# Patient Record
Sex: Male | Born: 1970 | Race: White | Hispanic: No | Marital: Married | Smoking: Current every day smoker
Health system: Southern US, Community
[De-identification: ages and names within clinical notes are randomized; demographics above are authoritative.]

## PROBLEM LIST (undated history)

## (undated) DIAGNOSIS — Z86718 Personal history of other venous thrombosis and embolism: Secondary | ICD-10-CM

## (undated) HISTORY — PX: HIP SURGERY: SHX245

## (undated) HISTORY — PX: TONSILLECTOMY: SUR1361

---

## 2007-04-12 ENCOUNTER — Ambulatory Visit: Payer: Self-pay

## 2007-05-03 ENCOUNTER — Ambulatory Visit: Payer: Self-pay | Admitting: Family Medicine

## 2007-08-28 IMAGING — CR DG LUMBAR SPINE AP/LAT/OBLIQUES W/ FLEX AND EXT
1 series · 5 of 5 positions shown · non-contrast
Comparison: none

REASON FOR EXAM: backache
COMMENTS:

[Series 1: view not recorded · 0.17mm/px · 5 of 5 slices shown]
[im 1/5]
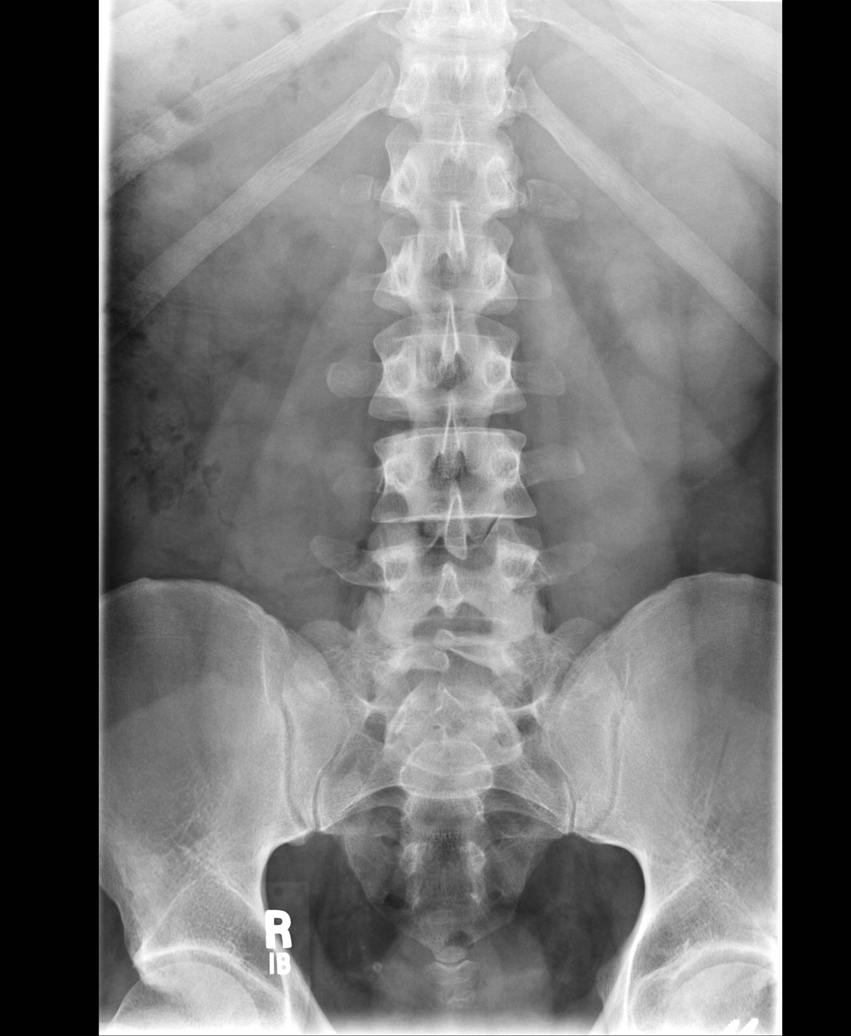
[im 2/5]
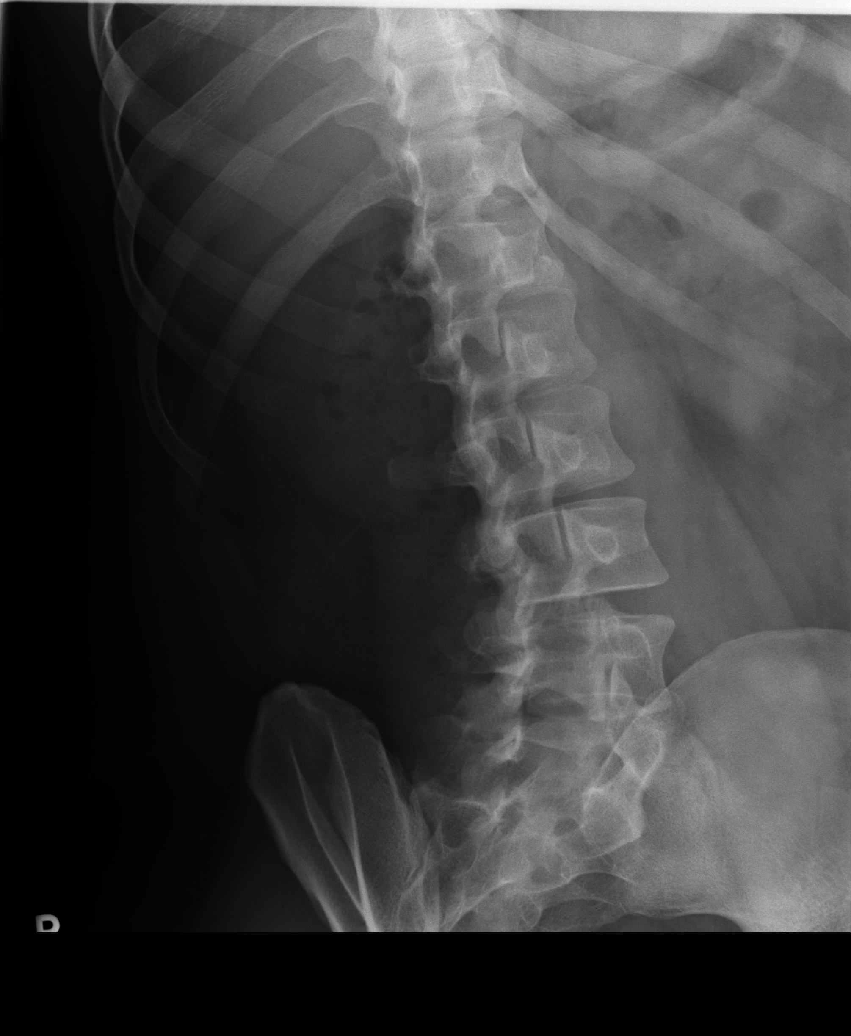
[im 3/5]
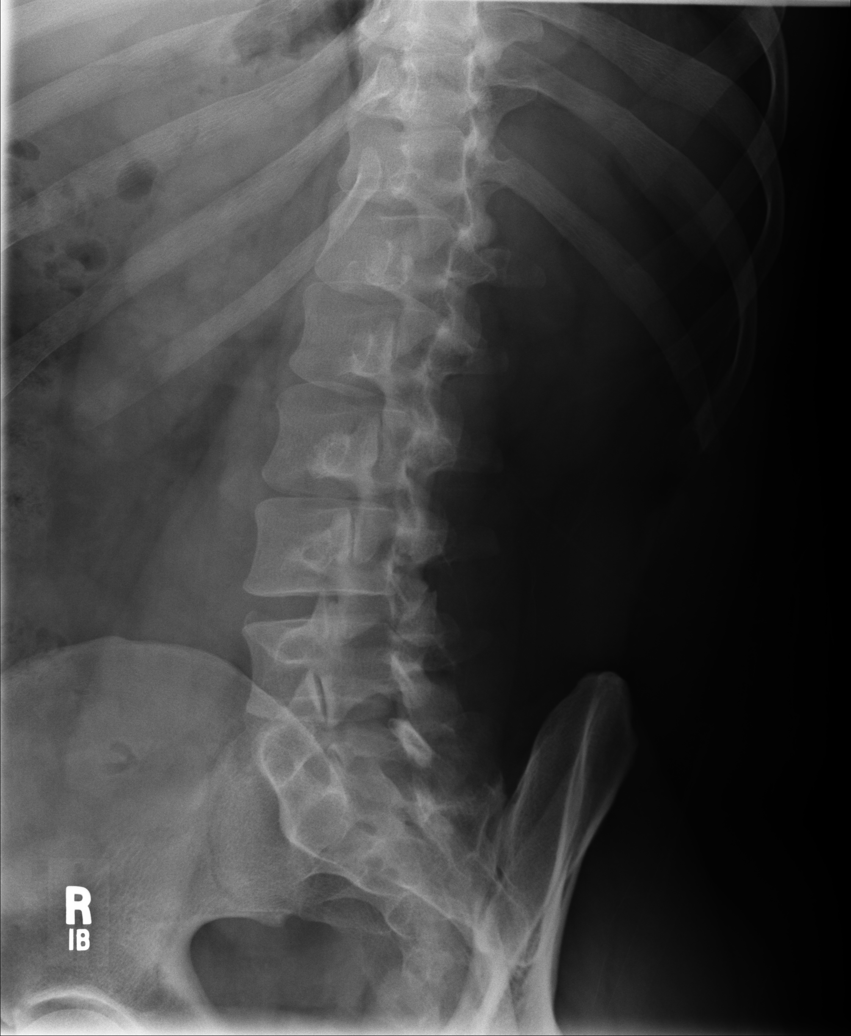
[im 4/5]
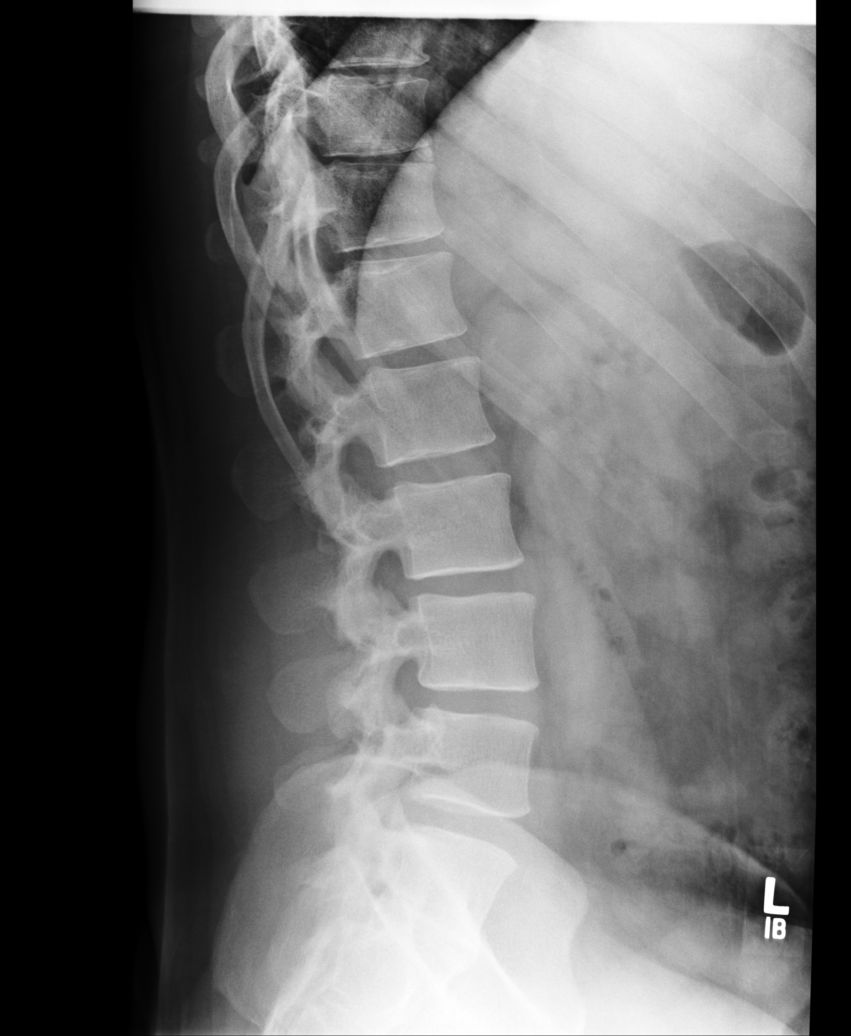
[im 5/5]
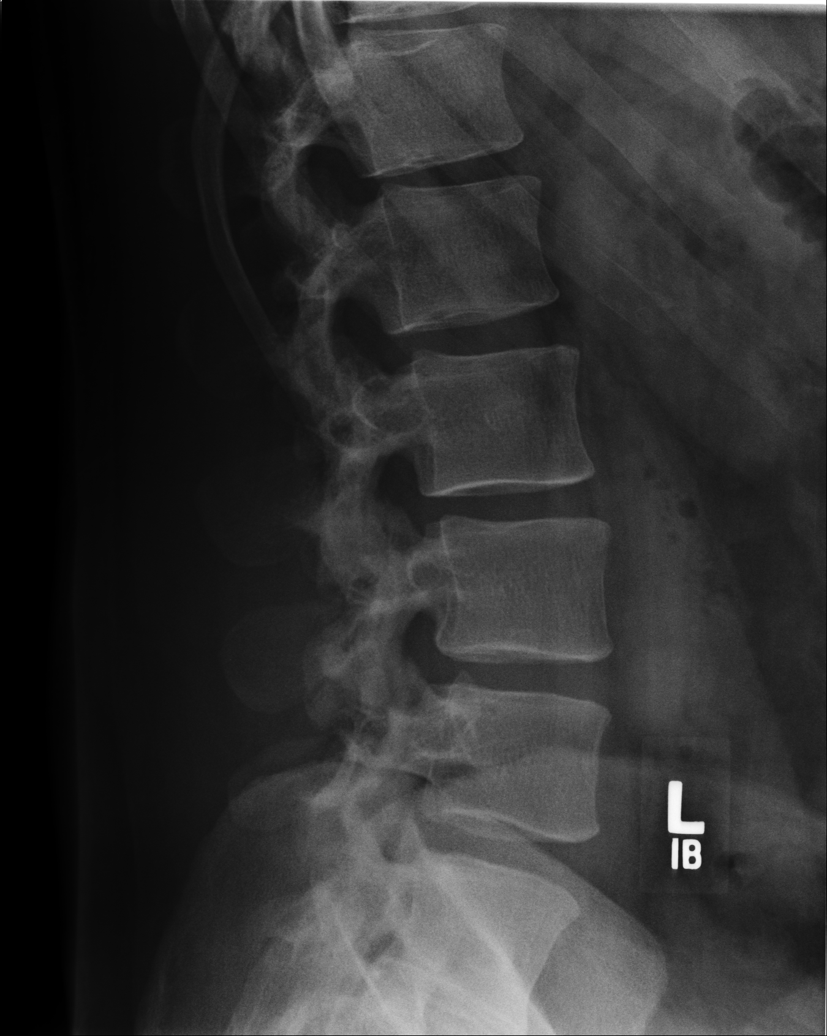

[5 of 5 positions shown; findings below may reference images not displayed]

PROCEDURE:     DXR - DXR LUMBAR SPINE WITH OBLIQUES  - April 12, 2007 [DATE]

RESULT:     This is a redictation of a previously dictated report.

There does not appear to be evidence of acute fracture, dislocation or
malalignment. Lucencies are appreciated along the transverse processes on
the RIGHT and LEFT at L1. These areas demonstrate sclerotic borders. These
findings may represent the sequela of chronic fractures versus congenital
nonunion.
IMPRESSION: No evidence of acute osseous abnormalities.

If there are persistent complaints of pain or persistent clinical concern,
further evaluation with MRI is recommended if clinically warranted.

## 2019-11-28 ENCOUNTER — Inpatient Hospital Stay (HOSPITAL_COMMUNITY)
Admission: EM | Admit: 2019-11-28 | Discharge: 2019-12-02 | DRG: 340 | Disposition: A | Discharge status: Home / Self Care | Payer: 59 | Attending: General Surgery | Admitting: General Surgery

## 2019-11-28 ENCOUNTER — Other Ambulatory Visit: Payer: Self-pay

## 2019-11-28 ENCOUNTER — Encounter (HOSPITAL_COMMUNITY): Payer: Self-pay | Admitting: Emergency Medicine

## 2019-11-28 DIAGNOSIS — Z20822 Contact with and (suspected) exposure to covid-19: Secondary | ICD-10-CM | POA: Diagnosis present

## 2019-11-28 DIAGNOSIS — K3532 Acute appendicitis with perforation and localized peritonitis, without abscess: Secondary | ICD-10-CM | POA: Diagnosis not present

## 2019-11-28 DIAGNOSIS — Z86718 Personal history of other venous thrombosis and embolism: Secondary | ICD-10-CM

## 2019-11-28 DIAGNOSIS — Z6832 Body mass index (BMI) 32.0-32.9, adult: Secondary | ICD-10-CM

## 2019-11-28 DIAGNOSIS — Z7982 Long term (current) use of aspirin: Secondary | ICD-10-CM

## 2019-11-28 DIAGNOSIS — F17200 Nicotine dependence, unspecified, uncomplicated: Secondary | ICD-10-CM | POA: Diagnosis present

## 2019-11-28 DIAGNOSIS — E669 Obesity, unspecified: Secondary | ICD-10-CM | POA: Diagnosis present

## 2019-11-28 DIAGNOSIS — R1031 Right lower quadrant pain: Secondary | ICD-10-CM | POA: Diagnosis not present

## 2019-11-28 HISTORY — DX: Personal history of other venous thrombosis and embolism: Z86.718

## 2019-11-28 LAB — COMPREHENSIVE METABOLIC PANEL
ALT: 42 U/L (ref 0–44)
AST: 31 U/L (ref 15–41)
Albumin: 3.6 g/dL (ref 3.5–5.0)
Alkaline Phosphatase: 86 U/L (ref 38–126)
Anion gap: 11 (ref 5–15)
BUN: 9 mg/dL (ref 6–20)
CO2: 22 mmol/L (ref 22–32)
Calcium: 8.8 mg/dL — ABNORMAL LOW (ref 8.9–10.3)
Chloride: 104 mmol/L (ref 98–111)
Creatinine, Ser: 1.01 mg/dL (ref 0.61–1.24)
GFR calc Af Amer: >60 mL/min (ref >60)
GFR calc non Af Amer: >60 mL/min (ref >60)
Glucose, Bld: 120 mg/dL — ABNORMAL HIGH (ref 70–99)
Potassium: 4 mmol/L (ref 3.5–5.1)
Sodium: 137 mmol/L (ref 135–145)
Total Bilirubin: 0.7 mg/dL (ref 0.3–1.2)
Total Protein: 6.9 g/dL (ref 6.5–8.1)

## 2019-11-28 LAB — CBC
HCT: 46.6 % (ref 39.0–52.0)
Hemoglobin: 14.9 g/dL (ref 13.0–17.0)
MCH: 28.1 pg (ref 26.0–34.0)
MCHC: 32 g/dL (ref 30.0–36.0)
MCV: 87.8 fL (ref 80.0–100.0)
Platelets: 264 10*3/uL (ref 150–400)
RBC: 5.31 MIL/uL (ref 4.22–5.81)
RDW: 13.3 % (ref 11.5–15.5)
WBC: 8.4 10*3/uL (ref 4.0–10.5)
nRBC: 0 % (ref 0.0–0.2)

## 2019-11-28 LAB — URINALYSIS, ROUTINE W REFLEX MICROSCOPIC
Bacteria, UA: NONE SEEN
Bilirubin Urine: NEGATIVE
Glucose, UA: NEGATIVE mg/dL
Hgb urine dipstick: NEGATIVE
Ketones, ur: NEGATIVE mg/dL
Leukocytes,Ua: NEGATIVE
Nitrite: NEGATIVE
Protein, ur: 30 mg/dL — AB
Specific Gravity, Urine: 1.026 (ref 1.005–1.030)
pH: 5 (ref 5.0–8.0)

## 2019-11-28 LAB — LIPASE, BLOOD: Lipase: 80 U/L — ABNORMAL HIGH (ref 11–51)

## 2019-11-28 MED ORDER — SODIUM CHLORIDE 0.9% FLUSH: 3.0000 mL | Freq: Once | INTRAVENOUS | Status: DC

## 2019-11-28 NOTE — ED Triage Notes (Signed)
Patient reports right abdominal pain onset Monday this week , denies emesis or diarrhea , no fever or chills .

## 2019-11-29 ENCOUNTER — Inpatient Hospital Stay (HOSPITAL_COMMUNITY): Payer: 59 | Admitting: Certified Registered Nurse Anesthetist

## 2019-11-29 ENCOUNTER — Emergency Department (HOSPITAL_COMMUNITY): Payer: 59

## 2019-11-29 ENCOUNTER — Encounter (HOSPITAL_COMMUNITY): Admission: EM | Disposition: A | Discharge status: Home / Self Care | Payer: Self-pay | Source: Home / Self Care

## 2019-11-29 ENCOUNTER — Encounter (HOSPITAL_COMMUNITY): Payer: Self-pay | Admitting: Radiology

## 2019-11-29 DIAGNOSIS — Z86718 Personal history of other venous thrombosis and embolism: Secondary | ICD-10-CM | POA: Diagnosis not present

## 2019-11-29 DIAGNOSIS — E669 Obesity, unspecified: Secondary | ICD-10-CM | POA: Diagnosis present

## 2019-11-29 DIAGNOSIS — Z7982 Long term (current) use of aspirin: Secondary | ICD-10-CM | POA: Diagnosis not present

## 2019-11-29 DIAGNOSIS — K3532 Acute appendicitis with perforation and localized peritonitis, without abscess: Secondary | ICD-10-CM | POA: Diagnosis present

## 2019-11-29 DIAGNOSIS — Z20822 Contact with and (suspected) exposure to covid-19: Secondary | ICD-10-CM | POA: Diagnosis present

## 2019-11-29 DIAGNOSIS — F17200 Nicotine dependence, unspecified, uncomplicated: Secondary | ICD-10-CM | POA: Diagnosis present

## 2019-11-29 DIAGNOSIS — Z6832 Body mass index (BMI) 32.0-32.9, adult: Secondary | ICD-10-CM | POA: Diagnosis not present

## 2019-11-29 DIAGNOSIS — R1031 Right lower quadrant pain: Secondary | ICD-10-CM | POA: Diagnosis present

## 2019-11-29 HISTORY — PX: APPENDECTOMY: SHX54

## 2019-11-29 HISTORY — PX: LAPAROSCOPIC APPENDECTOMY: SHX408

## 2019-11-29 LAB — SURGICAL PCR SCREEN
MRSA, PCR: NEGATIVE
Staphylococcus aureus: NEGATIVE

## 2019-11-29 LAB — RESPIRATORY PANEL BY RT PCR (FLU A&B, COVID)
Influenza A by PCR: NEGATIVE
Influenza B by PCR: NEGATIVE
SARS Coronavirus 2 by RT PCR: NEGATIVE

## 2019-11-29 SURGERY — APPENDECTOMY, LAPAROSCOPIC
Anesthesia: General | Site: Abdomen

## 2019-11-29 MED ORDER — SUGAMMADEX SODIUM 200 MG/2ML IV SOLN
INTRAVENOUS | Status: DC | PRN
  Administered 2019-11-29: 400 mg via INTRAVENOUS

## 2019-11-29 MED ORDER — ACETAMINOPHEN 500 MG PO TABS
1000.0000 mg | ORAL_TABLET | ORAL | Status: AC
  Administered 2019-11-29: 1000 mg via ORAL
  Filled 2019-11-29: qty 2

## 2019-11-29 MED ORDER — LIDOCAINE 2% (20 MG/ML) 5 ML SYRINGE
INTRAMUSCULAR | Status: AC
  Filled 2019-11-29: qty 5

## 2019-11-29 MED ORDER — METOPROLOL TARTRATE 5 MG/5ML IV SOLN: 5.0000 mg | Freq: Four times a day (QID) | INTRAVENOUS | Status: DC | PRN

## 2019-11-29 MED ORDER — ALBUTEROL SULFATE HFA 108 (90 BASE) MCG/ACT IN AERS
INHALATION_SPRAY | RESPIRATORY_TRACT | Status: DC | PRN
  Administered 2019-11-29: 6 via RESPIRATORY_TRACT

## 2019-11-29 MED ORDER — HYDROMORPHONE HCL 1 MG/ML IJ SOLN: 0.2500 mg | INTRAMUSCULAR | Status: DC | PRN

## 2019-11-29 MED ORDER — KETOROLAC TROMETHAMINE 30 MG/ML IJ SOLN
INTRAMUSCULAR | Status: AC
  Filled 2019-11-29: qty 1

## 2019-11-29 MED ORDER — FENTANYL CITRATE (PF) 250 MCG/5ML IJ SOLN
INTRAMUSCULAR | Status: DC | PRN
  Administered 2019-11-29: 50 ug via INTRAVENOUS
  Administered 2019-11-29: 100 ug via INTRAVENOUS

## 2019-11-29 MED ORDER — MIDAZOLAM HCL 2 MG/2ML IJ SOLN
INTRAMUSCULAR | Status: DC | PRN
  Administered 2019-11-29: 2 mg via INTRAVENOUS

## 2019-11-29 MED ORDER — ONDANSETRON HCL 4 MG/2ML IJ SOLN
INTRAMUSCULAR | Status: AC
  Filled 2019-11-29: qty 2

## 2019-11-29 MED ORDER — OXYCODONE HCL 5 MG PO TABS
5.0000 mg | ORAL_TABLET | ORAL | Status: DC | PRN
  Administered 2019-11-29 – 2019-12-01 (×4): 10 mg via ORAL
  Filled 2019-11-29 (×4): qty 2

## 2019-11-29 MED ORDER — KETOROLAC TROMETHAMINE 30 MG/ML IJ SOLN: 30.0000 mg | Freq: Four times a day (QID) | INTRAMUSCULAR | Status: DC | PRN

## 2019-11-29 MED ORDER — METHOCARBAMOL 500 MG PO TABS: 500.0000 mg | ORAL_TABLET | Freq: Four times a day (QID) | ORAL | Status: DC | PRN

## 2019-11-29 MED ORDER — PIPERACILLIN-TAZOBACTAM 3.375 G IVPB 30 MIN
3.3750 g | Freq: Once | INTRAVENOUS | Status: AC
  Administered 2019-11-29: 3.375 g via INTRAVENOUS
  Filled 2019-11-29: qty 50

## 2019-11-29 MED ORDER — FENTANYL CITRATE (PF) 250 MCG/5ML IJ SOLN
INTRAMUSCULAR | Status: AC
  Filled 2019-11-29: qty 5

## 2019-11-29 MED ORDER — VASOPRESSIN 20 UNIT/ML IV SOLN
INTRAVENOUS | Status: AC
  Filled 2019-11-29: qty 1

## 2019-11-29 MED ORDER — OXYCODONE HCL 5 MG/5ML PO SOLN: 5.0000 mg | Freq: Once | ORAL | Status: DC | PRN

## 2019-11-29 MED ORDER — LIDOCAINE 2% (20 MG/ML) 5 ML SYRINGE
INTRAMUSCULAR | Status: DC | PRN
  Administered 2019-11-29: 60 mg via INTRAVENOUS

## 2019-11-29 MED ORDER — SODIUM CHLORIDE 0.9 % IR SOLN
Status: DC | PRN
  Administered 2019-11-29: 3

## 2019-11-29 MED ORDER — ROCURONIUM BROMIDE 10 MG/ML (PF) SYRINGE
PREFILLED_SYRINGE | INTRAVENOUS | Status: DC | PRN
  Administered 2019-11-29: 20 mg via INTRAVENOUS
  Administered 2019-11-29: 40 mg via INTRAVENOUS

## 2019-11-29 MED ORDER — ONDANSETRON HCL 4 MG/2ML IJ SOLN
INTRAMUSCULAR | Status: DC | PRN
  Administered 2019-11-29: 4 mg via INTRAVENOUS

## 2019-11-29 MED ORDER — BUPIVACAINE-EPINEPHRINE 0.25% -1:200000 IJ SOLN
INTRAMUSCULAR | Status: DC | PRN
  Administered 2019-11-29: 30 mL

## 2019-11-29 MED ORDER — POLYETHYLENE GLYCOL 3350 17 G PO PACK: 17.0000 g | PACK | Freq: Every day | ORAL | Status: DC | PRN

## 2019-11-29 MED ORDER — DEXTROSE-NACL 5-0.9 % IV SOLN: INTRAVENOUS | Status: DC

## 2019-11-29 MED ORDER — PROPOFOL 10 MG/ML IV BOLUS
INTRAVENOUS | Status: AC
  Filled 2019-11-29: qty 20

## 2019-11-29 MED ORDER — PANTOPRAZOLE SODIUM 40 MG IV SOLR
40.0000 mg | Freq: Every day | INTRAVENOUS | Status: DC
  Administered 2019-11-29 – 2019-12-01 (×3): 40 mg via INTRAVENOUS
  Filled 2019-11-29 (×3): qty 40

## 2019-11-29 MED ORDER — DIPHENHYDRAMINE HCL 50 MG/ML IJ SOLN: 25.0000 mg | Freq: Four times a day (QID) | INTRAMUSCULAR | Status: DC | PRN

## 2019-11-29 MED ORDER — MIDAZOLAM HCL 2 MG/2ML IJ SOLN
INTRAMUSCULAR | Status: AC
  Filled 2019-11-29: qty 2

## 2019-11-29 MED ORDER — PROMETHAZINE HCL 25 MG/ML IJ SOLN: 6.2500 mg | INTRAMUSCULAR | Status: DC | PRN

## 2019-11-29 MED ORDER — DEXAMETHASONE SODIUM PHOSPHATE 10 MG/ML IJ SOLN
INTRAMUSCULAR | Status: AC
  Filled 2019-11-29: qty 1

## 2019-11-29 MED ORDER — ENOXAPARIN SODIUM 40 MG/0.4ML ~~LOC~~ SOLN
40.0000 mg | Freq: Every day | SUBCUTANEOUS | Status: DC
  Administered 2019-11-29 – 2019-12-02 (×4): 40 mg via SUBCUTANEOUS
  Filled 2019-11-29 (×4): qty 0.4

## 2019-11-29 MED ORDER — KETOROLAC TROMETHAMINE 30 MG/ML IJ SOLN
30.0000 mg | Freq: Once | INTRAMUSCULAR | Status: AC | PRN
  Administered 2019-11-29: 30 mg via INTRAVENOUS

## 2019-11-29 MED ORDER — DOCUSATE SODIUM 100 MG PO CAPS
100.0000 mg | ORAL_CAPSULE | Freq: Two times a day (BID) | ORAL | Status: DC
  Administered 2019-11-29 – 2019-12-02 (×6): 100 mg via ORAL
  Filled 2019-11-29 (×6): qty 1

## 2019-11-29 MED ORDER — BUPIVACAINE HCL (PF) 0.25 % IJ SOLN
INTRAMUSCULAR | Status: AC
  Filled 2019-11-29: qty 30

## 2019-11-29 MED ORDER — ONDANSETRON 4 MG PO TBDP: 4.0000 mg | ORAL_TABLET | Freq: Four times a day (QID) | ORAL | Status: DC | PRN

## 2019-11-29 MED ORDER — OXYCODONE HCL 5 MG PO TABS: 5.0000 mg | ORAL_TABLET | Freq: Once | ORAL | Status: DC | PRN

## 2019-11-29 MED ORDER — PROPOFOL 10 MG/ML IV BOLUS
INTRAVENOUS | Status: DC | PRN
  Administered 2019-11-29: 200 mg via INTRAVENOUS

## 2019-11-29 MED ORDER — IOHEXOL 300 MG/ML  SOLN
100.0000 mL | Freq: Once | INTRAMUSCULAR | Status: AC | PRN
  Administered 2019-11-29: 100 mL via INTRAVENOUS

## 2019-11-29 MED ORDER — DEXAMETHASONE SODIUM PHOSPHATE 10 MG/ML IJ SOLN
INTRAMUSCULAR | Status: DC | PRN
  Administered 2019-11-29: 10 mg via INTRAVENOUS

## 2019-11-29 MED ORDER — ONDANSETRON HCL 4 MG/2ML IJ SOLN: 4.0000 mg | Freq: Four times a day (QID) | INTRAMUSCULAR | Status: DC | PRN

## 2019-11-29 MED ORDER — FENTANYL CITRATE (PF) 100 MCG/2ML IJ SOLN
100.0000 ug | Freq: Once | INTRAMUSCULAR | Status: AC
  Administered 2019-11-29: 02:00:00 100 ug via INTRAVENOUS
  Filled 2019-11-29: qty 2

## 2019-11-29 MED ORDER — HYDROMORPHONE HCL 1 MG/ML IJ SOLN
0.5000 mg | INTRAMUSCULAR | Status: DC | PRN
  Administered 2019-11-29: 0.5 mg via INTRAVENOUS
  Filled 2019-11-29: qty 1

## 2019-11-29 MED ORDER — 0.9 % SODIUM CHLORIDE (POUR BTL) OPTIME
TOPICAL | Status: DC | PRN
  Administered 2019-11-29: 1000 mL

## 2019-11-29 MED ORDER — DIPHENHYDRAMINE HCL 25 MG PO CAPS: 25.0000 mg | ORAL_CAPSULE | Freq: Four times a day (QID) | ORAL | Status: DC | PRN

## 2019-11-29 MED ORDER — PHENYLEPHRINE 40 MCG/ML (10ML) SYRINGE FOR IV PUSH (FOR BLOOD PRESSURE SUPPORT)
PREFILLED_SYRINGE | INTRAVENOUS | Status: DC | PRN
  Administered 2019-11-29: 80 ug via INTRAVENOUS
  Administered 2019-11-29: 120 ug via INTRAVENOUS

## 2019-11-29 MED ORDER — ONDANSETRON HCL 4 MG/2ML IJ SOLN
4.0000 mg | Freq: Once | INTRAMUSCULAR | Status: AC
  Administered 2019-11-29: 4 mg via INTRAVENOUS
  Filled 2019-11-29: qty 2

## 2019-11-29 MED ORDER — PHENYLEPHRINE HCL-NACL 10-0.9 MG/250ML-% IV SOLN: INTRAVENOUS | Status: DC | PRN

## 2019-11-29 MED ORDER — GABAPENTIN 300 MG PO CAPS
300.0000 mg | ORAL_CAPSULE | ORAL | Status: AC
  Administered 2019-11-29: 08:00:00 300 mg via ORAL
  Filled 2019-11-29: qty 1

## 2019-11-29 MED ORDER — LACTATED RINGERS IV SOLN: INTRAVENOUS | Status: DC

## 2019-11-29 MED ORDER — PIPERACILLIN-TAZOBACTAM 3.375 G IVPB
3.3750 g | Freq: Three times a day (TID) | INTRAVENOUS | Status: DC
  Administered 2019-11-29 – 2019-12-02 (×9): 3.375 g via INTRAVENOUS
  Filled 2019-11-29 (×9): qty 50

## 2019-11-29 MED ORDER — SIMETHICONE 80 MG PO CHEW: 40.0000 mg | CHEWABLE_TABLET | Freq: Four times a day (QID) | ORAL | Status: DC | PRN

## 2019-11-29 MED ORDER — ACETAMINOPHEN 500 MG PO TABS
1000.0000 mg | ORAL_TABLET | Freq: Four times a day (QID) | ORAL | Status: DC
  Administered 2019-11-29 – 2019-12-02 (×12): 1000 mg via ORAL
  Filled 2019-11-29 (×12): qty 2

## 2019-11-29 MED ORDER — HYDROMORPHONE HCL 1 MG/ML IJ SOLN: 0.5000 mg | INTRAMUSCULAR | Status: DC | PRN

## 2019-11-29 SURGICAL SUPPLY — 53 items
APPLIER CLIP ROT 10 11.4 M/L (STAPLE)
BENZOIN TINCTURE PRP APPL 2/3 (GAUZE/BANDAGES/DRESSINGS) ×3 IMPLANT
BLADE CLIPPER SURG (BLADE) ×3 IMPLANT
BNDG ADH 1X3 SHEER STRL LF (GAUZE/BANDAGES/DRESSINGS) IMPLANT
CANISTER SUCT 3000ML PPV (MISCELLANEOUS) ×3 IMPLANT
CHLORAPREP W/TINT 26 (MISCELLANEOUS) ×3 IMPLANT
CLIP APPLIE ROT 10 11.4 M/L (STAPLE) IMPLANT
CLOSURE WOUND 1/2 X4 (GAUZE/BANDAGES/DRESSINGS) ×1
COVER SURGICAL LIGHT HANDLE (MISCELLANEOUS) ×3 IMPLANT
COVER WAND RF STERILE (DRAPES) IMPLANT
CUTTER FLEX LINEAR 45M (STAPLE) ×3 IMPLANT
DERMABOND ADVANCED (GAUZE/BANDAGES/DRESSINGS)
DERMABOND ADVANCED .7 DNX12 (GAUZE/BANDAGES/DRESSINGS) IMPLANT
DRSG TEGADERM 4X4.75 (GAUZE/BANDAGES/DRESSINGS) ×3 IMPLANT
ELECT REM PT RETURN 9FT ADLT (ELECTROSURGICAL) ×3
ELECTRODE REM PT RTRN 9FT ADLT (ELECTROSURGICAL) ×1 IMPLANT
ENDOLOOP SUT PDS II  0 18 (SUTURE)
ENDOLOOP SUT PDS II 0 18 (SUTURE) IMPLANT
GAUZE SPONGE 2X2 8PLY STRL LF (GAUZE/BANDAGES/DRESSINGS) ×1 IMPLANT
GLOVE BIOGEL M STRL SZ7.5 (GLOVE) ×3 IMPLANT
GLOVE INDICATOR 8.0 STRL GRN (GLOVE) ×6 IMPLANT
GOWN STRL REUS W/ TWL LRG LVL3 (GOWN DISPOSABLE) ×2 IMPLANT
GOWN STRL REUS W/TWL 2XL LVL3 (GOWN DISPOSABLE) ×3 IMPLANT
GOWN STRL REUS W/TWL LRG LVL3 (GOWN DISPOSABLE) ×4
GRASPER SUT TROCAR 14GX15 (MISCELLANEOUS) ×3 IMPLANT
IV NS 1000ML (IV SOLUTION) ×8
IV NS 1000ML BAXH (IV SOLUTION) ×4 IMPLANT
KIT BASIN OR (CUSTOM PROCEDURE TRAY) ×3 IMPLANT
KIT TURNOVER KIT B (KITS) ×3 IMPLANT
NS IRRIG 1000ML POUR BTL (IV SOLUTION) ×3 IMPLANT
PAD ARMBOARD 7.5X6 YLW CONV (MISCELLANEOUS) ×6 IMPLANT
POUCH RETRIEVAL ECOSAC 10 (ENDOMECHANICALS) ×1 IMPLANT
POUCH RETRIEVAL ECOSAC 10MM (ENDOMECHANICALS) ×2
RELOAD 45 VASCULAR/THIN (ENDOMECHANICALS) ×3 IMPLANT
RELOAD STAPLE TA45 3.5 REG BLU (ENDOMECHANICALS) IMPLANT
SCISSORS LAP 5X35 DISP (ENDOMECHANICALS) IMPLANT
SET IRRIG TUBING LAPAROSCOPIC (IRRIGATION / IRRIGATOR) ×3 IMPLANT
SET TUBE SMOKE EVAC HIGH FLOW (TUBING) ×3 IMPLANT
SHEARS HARMONIC ACE PLUS 36CM (ENDOMECHANICALS) ×3 IMPLANT
SLEEVE ENDOPATH XCEL 5M (ENDOMECHANICALS) ×3 IMPLANT
SPECIMEN JAR SMALL (MISCELLANEOUS) ×3 IMPLANT
SPONGE GAUZE 2X2 STER 10/PKG (GAUZE/BANDAGES/DRESSINGS) ×2
STRIP CLOSURE SKIN 1/2X4 (GAUZE/BANDAGES/DRESSINGS) ×2 IMPLANT
SUT MNCRL AB 4-0 PS2 18 (SUTURE) ×3 IMPLANT
SUT VICRYL 0 UR6 27IN ABS (SUTURE) ×3 IMPLANT
TOWEL GREEN STERILE (TOWEL DISPOSABLE) ×3 IMPLANT
TOWEL GREEN STERILE FF (TOWEL DISPOSABLE) ×3 IMPLANT
TRAY FOLEY W/BAG SLVR 16FR (SET/KITS/TRAYS/PACK)
TRAY FOLEY W/BAG SLVR 16FR ST (SET/KITS/TRAYS/PACK) IMPLANT
TRAY LAPAROSCOPIC MC (CUSTOM PROCEDURE TRAY) ×3 IMPLANT
TROCAR XCEL BLUNT TIP 100MML (ENDOMECHANICALS) ×3 IMPLANT
TROCAR XCEL NON-BLD 5MMX100MML (ENDOMECHANICALS) ×3 IMPLANT
WATER STERILE IRR 1000ML POUR (IV SOLUTION) IMPLANT

## 2019-11-29 NOTE — Anesthesia Preprocedure Evaluation (Addendum)
Anesthesia Evaluation  Patient identified by MRN, date of birth, ID band Patient awake    Reviewed: Allergy & Precautions, NPO status , Patient's Chart, lab work & pertinent test results  Airway Mallampati: II  TM Distance: >3 FB Neck ROM: Full    Dental no notable dental hx.    Pulmonary Current Smoker and Patient abstained from smoking.,    Pulmonary exam normal breath sounds clear to auscultation       Cardiovascular negative cardio ROS Normal cardiovascular exam Rhythm:Regular Rate:Normal     Neuro/Psych negative neurological ROS  negative psych ROS   GI/Hepatic negative GI ROS, Neg liver ROS,   Endo/Other  negative endocrine ROS  Renal/GU negative Renal ROS     Musculoskeletal negative musculoskeletal ROS (+)   Abdominal (+) + obese,   Peds  Hematology negative hematology ROS (+)   Anesthesia Other Findings perforated appendicitis  Reproductive/Obstetrics                            Anesthesia Physical Anesthesia Plan  ASA: II  Anesthesia Plan: General   Post-op Pain Management:    Induction: Intravenous  PONV Risk Score and Plan: 2 and Ondansetron, Dexamethasone, Midazolam and Treatment may vary due to age or medical condition  Airway Management Planned: Oral ETT  Additional Equipment:   Intra-op Plan:   Post-operative Plan: Extubation in OR  Informed Consent: I have reviewed the patients History and Physical, chart, labs and discussed the procedure including the risks, benefits and alternatives for the proposed anesthesia with the patient or authorized representative who has indicated his/her understanding and acceptance.     Dental advisory given  Plan Discussed with: CRNA  Anesthesia Plan Comments:        Anesthesia Quick Evaluation

## 2019-11-29 NOTE — Anesthesia Procedure Notes (Signed)

## 2019-11-29 NOTE — Plan of Care (Signed)
  Problem: Education: Goal: Required Educational Video(s) Outcome: Progressing   

## 2019-11-29 NOTE — Anesthesia Postprocedure Evaluation (Signed)
Anesthesia Post Note  Patient: Thomas Campos  Procedure(s) Performed: APPENDECTOMY LAPAROSCOPIC (N/A Abdomen)     Patient location during evaluation: PACU Anesthesia Type: General Level of consciousness: awake and alert Pain management: pain level controlled Vital Signs Assessment: post-procedure vital signs reviewed and stable Respiratory status: spontaneous breathing, nonlabored ventilation, respiratory function stable and patient connected to nasal cannula oxygen Cardiovascular status: blood pressure returned to baseline and stable Postop Assessment: no apparent nausea or vomiting Anesthetic complications: no    Last Vitals:  Vitals:   11/29/19 1222 11/29/19 1500  BP: 121/82 107/80  Pulse: 88 79  Resp: 14 18  Temp: 36.7 C 36.6 C  SpO2: 98% 95%    Last Pain:  Vitals:   11/29/19 1500  TempSrc: Oral  PainSc:                  Damonica Chopra P Tazaria Dlugosz

## 2019-11-29 NOTE — Progress Notes (Signed)
Pharmacy Antibiotic Note  Thomas Campos is a 49 y.o. male admitted on 11/28/2019 with perforated appendicitis.  Pharmacy has been consulted for Zosyn dosing. WBC WNL. Renal function good.   Plan: Zosyn 3.375G IV q8h to be infused over 4 hours Trend WBC, temp, renal function  F/U infectious work-up   Height: 5\' 11"  (180.3 cm) Weight: 231 lb 7.7 oz (105 kg) IBW/kg (Calculated) : 75.3  Temp (24hrs), Avg:98.7 F (37.1 C), Min:98.6 F (37 C), Max:98.7 F (37.1 C)  Recent Labs  Lab 11/28/19 2105  WBC 8.4  CREATININE 1.01    Estimated Creatinine Clearance: 111.5 mL/min (by C-G formula based on SCr of 1.01 mg/dL).    No Known Allergies  2106, PharmD, BCPS Clinical Pharmacist Phone: [phone]

## 2019-11-29 NOTE — Transfer of Care (Signed)
Immediate Anesthesia Transfer of Care Note  Patient: Thomas Campos  Procedure(s) Performed: APPENDECTOMY LAPAROSCOPIC (N/A Abdomen)  Patient Location: PACU  Anesthesia Type:General  Level of Consciousness: drowsy  Airway & Oxygen Therapy: Patient Spontanous Breathing and Patient connected to face mask oxygen  Post-op Assessment: Report given to RN and Post -op Vital signs reviewed and stable  Post vital signs: Reviewed and stable  Last Vitals:  Vitals Value Taken Time  BP 137/75   Temp    Pulse 103 11/29/19 1112  Resp 14 11/29/19 1112  SpO2 94 % 11/29/19 1112  Vitals shown include unvalidated device data.  Last Pain:  Vitals:   11/29/19 0750  TempSrc:   PainSc: 0-No pain      Patients Stated Pain Goal: 2 (11/29/19 0504)  Complications: No apparent anesthesia complications

## 2019-11-29 NOTE — Op Note (Signed)
Thomas Campos [id number] Feb 04, 1971 11/29/2019  Appendectomy, Lap, Procedure Note  Indications: The patient presented with a history of right-sided abdominal pain. A CT revealed findings consistent with acute perforated appendicitis.  There was some inflammation of the right lower quadrant but no drainable abscess.  We discussed preoperatively IV antibiotic alone versus surgical intervention.  We discussed the pros and cons of each approach.  He preferred to go with a surgical intervention which I concurred with.  Pre-operative Diagnosis: acute perforated appendicitis  Post-operative Diagnosis: acute perforated appendicitis, purulent & feculent peritonitis  Surgeon: Greer Pickerel MD FACS  Assistants: none  Anesthesia: General endotracheal anesthesia  Procedure Details  The patient was seen again in the Holding Room. The risks, benefits, complications, treatment options, and expected outcomes were discussed with the patient and/or family. The possibilities of perforation of viscus, bleeding, recurrent infection, the need for additional procedures, failure to diagnose a condition, and creating a complication requiring transfusion or operation were discussed. There was concurrence with the proposed plan and informed consent was obtained. The site of surgery was properly noted. The patient was taken to Operating Room, identified as Thomas Campos and the procedure verified as Appendectomy. A Time Out was held and the above information confirmed.  The patient was placed in the supine position and general anesthesia was induced, along with placement of orogastric tube, SCDs, and a Foley catheter. The abdomen was prepped and draped in a sterile fashion. A 1.5 centimeter infraumbilical incision was made.  The umbilical stalk was elevated, and the midline fascia was incised with a #11 blade.  A Kelly clamp was used to confirm entrance into the peritoneal cavity.  A pursestring suture was passed around  the incision with a 0 Vicryl.  A 55mm Hasson was introduced into the abdomen and the tails of the suture were used to hold the Hasson in place.   The pneumoperitoneum was then established to steady pressure of 15 mmHg.  Additional 5 mm cannulas then placed in the left lower quadrant of the abdomen and the suprapubic region under direct visualization. A careful evaluation of the entire abdomen was carried out. The patient was placed in Trendelenburg and left lateral decubitus position.  There was purulent fluid in the right lower quadrant and in the pelvis and above the liver.  There was some inflammatory exudate on the small bowel and along the right paracolic gutter around the cecum.  There was injection of the peritoneum consistent with peritonitis.  Some of the small bowel was also dilated as well.  The small intestines were retracted in the cephalad and left lateral direction away from the pelvis and right lower quadrant. The patient was found to have an inflamed appendix that was extending into the pelvis. There was  evidence of perforation.  Perforation appeared to be posteriorly along the body of the appendix.  There was a small ball which was removed.  The appendix was carefully dissected. The appendix was was skeletonized with the harmonic scalpel.  Was taking down the mesoappendix at the appendix fractured into 2 parts.  There was still an intact 3 cm segment going down to the base and to the cecum.  The appendix was divided at its base using an endo-GIA stapler with a white load. No appendiceal stump was left in place. The the pieces of the appendix were removed from the abdomen with an Ecoo bag along with a few tiny stool balls Through the umbilical port.  There was no evidence of bleeding, leakage,  or complication after division of the appendix. 4 Liters of irrigation was also performed and irrigate suctioned from the abdomen as well.  The staple line was reinspected.  He was hemostatic.  It was  intact.  Local was infiltrated along the bilateral lateral abdominal walls as a tap block  The umbilical port site was closed with the purse string suture. The closure was viewed laparoscopically. There was a small residual palpable fascial defect.  2 additional interrupted 0 Vicryl sutures were placed at the umbilical fascia using a PMI suture passer.  The trocar site skin wounds were closed with 4-0 Monocryl.  Benzoin, Steri-Strips and sterile bandages were applied to the skin incisions.  Instrument, sponge, and needle counts were correct at the conclusion of the case.   Findings: The appendix was found to be inflamed. There were signs of necrosis.  There was perforation. There was not abscess formation.  Estimated Blood Loss:  Minimal         Drains: none         Specimens: appendix in pieces         Complications:  None; patient tolerated the procedure well.         Disposition: PACU - hemodynamically stable.         Condition: stable  Thomas Campos. Andrey Campanile, MD, FACS General, Bariatric, & Minimally Invasive Surgery [facility], [place]

## 2019-11-29 NOTE — H&P (Signed)
Thomas Campos is an 49 y.o. male.   Chief Complaint: abd pain HPI:  Patient is a 49 year old male who comes in secondary to 3 days of abdominal pain.  Patient states that the pain began periumbilically and then localized in the right lower quadrant.  Patient states that he has been taking Tylenol/ibuprofen help with the pain and been continuing to work.  He states that he had no fever or chills however did have some diarrhea.  He states that secondary to ongoing pain he presented to the ER for further evaluation.  Upon evaluation the ER he underwent CT scan which revealed perforated appendicitis, no signs of abscess or fluid.  General surgery was consulted for further evaluation and management.  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  History reviewed. No pertinent past medical history.  History reviewed. No pertinent surgical history.  No family history on file. Social History:  reports that he has been smoking. He has never used smokeless tobacco. He reports previous alcohol use. He reports that he does not use drugs.  Allergies: No Known Allergies  (Not in a hospital admission)   Results for orders placed or performed during the hospital encounter of 11/28/19 (from the past 48 hour(s))  Urinalysis, Routine w reflex microscopic     Status: Abnormal   Collection Time: 11/28/19  9:03 PM  Result Value Ref Range   Color, Urine YELLOW YELLOW   APPearance CLEAR CLEAR   Specific Gravity, Urine 1.026 1.005 - 1.030   pH 5.0 5.0 - 8.0   Glucose, UA NEGATIVE NEGATIVE mg/dL   Hgb urine dipstick NEGATIVE NEGATIVE   Bilirubin Urine NEGATIVE NEGATIVE   Ketones, ur NEGATIVE NEGATIVE mg/dL   Protein, ur 30 (A) NEGATIVE mg/dL   Nitrite NEGATIVE NEGATIVE   Leukocytes,Ua NEGATIVE NEGATIVE   RBC / HPF 0-5 0 - 5 RBC/hpf   WBC, UA 0-5 0 - 5 WBC/hpf   Bacteria, UA NONE SEEN  NONE SEEN   Mucus PRESENT     Comment: Performed at [facility], [address], [place], [place] [address]  Lipase, blood     Status: Abnormal   Collection Time: 11/28/19  9:05 PM  Result Value Ref Range   Lipase 80 (H) 11 - 51 U/L    Comment: Performed at [facility] [address], [place], [place] [address]  Comprehensive metabolic panel     Status: Abnormal   Collection Time: 11/28/19  9:05 PM  Result Value Ref Range   Sodium 137 135 - 145 mmol/L   Potassium 4.0 3.5 - 5.1 mmol/L   Chloride 104 98 - 111 mmol/L   CO2 22 22 - 32 mmol/L   Glucose, Bld 120 (H) 70 - 99 mg/dL    Comment: Glucose reference range applies only to samples taken after fasting for at least 8 hours.   BUN 9 6 - 20 mg/dL   Creatinine, Ser 1.01 0.61 - 1.24 mg/dL   Calcium 8.8 (L) 8.9 - 10.3 mg/dL   Total Protein 6.9 6.5 - 8.1 g/dL   Albumin 3.6 3.5 - 5.0 g/dL   AST 31 15 - 41 U/L   ALT 42 0 - 44 U/L   Alkaline Phosphatase 86 38 - 126 U/L   Total Bilirubin 0.7 0.3 - 1.2 mg/dL   GFR calc non Af Amer >60 >60 mL/min   GFR calc  Af Amer >60 >60 mL/min   Anion gap 11 5 - 15    Comment: Performed at [facility], [address], [place], [place] [address]  CBC     Status: None   Collection Time: 11/28/19  9:05 PM  Result Value Ref Range   WBC 8.4 4.0 - 10.5 K/uL   RBC 5.31 4.22 - 5.81 MIL/uL   Hemoglobin 14.9 13.0 - 17.0 g/dL   HCT 32.4 40.1 - 02.7 %   MCV 87.8 80.0 - 100.0 fL   MCH 28.1 26.0 - 34.0 pg   MCHC 32.0 30.0 - 36.0 g/dL   RDW 25.3 66.4 - 40.3 %   Platelets 264 150 - 400 K/uL   nRBC 0.0 0.0 - 0.2 %    Comment: Performed at [facility], [address], [place], [place] [address]   CT ABDOMEN PELVIS W CONTRAST  Result Date: 11/29/2019 CLINICAL DATA:  Acute nonlocalized abdominal pain EXAM: CT ABDOMEN AND PELVIS WITH CONTRAST TECHNIQUE: Multidetector CT imaging of the abdomen and pelvis was performed using the standard protocol following bolus administration of  intravenous contrast. CONTRAST:  OMNIPAQUE IOHEXOL 300 MG/ML  SOLN COMPARISON:  None. FINDINGS: Lower chest:  Negative Hepatobiliary: Partial coverage that is negative.no evidence of biliary obstruction or stone. Pancreas: Unremarkable. Spleen: Unremarkable. Adrenals/Urinary Tract: Negative adrenals. No hydronephrosis or stone. Unremarkable bladder. Stomach/Bowel: The appendix is dilated to 15 mm with frothy contents versus stool in the lumen. The mesoappendix is inflamed and there is tiny bubble of extraluminal gas rightward of the base of the appendix. On coronal reformats gas is associated with the indistinct posterior wall, the site of perforation. No appendicolith. The appendix is in expected location extending inferiorly from the cecum. No abscess. Vascular/Lymphatic: No acute vascular abnormality. Aorto iliac atherosclerotic calcification. No mass or adenopathy. Reproductive:Negative Other: No ascites or pneumoperitoneum. Musculoskeletal: Left greater trochanter and medial femoral head repair with degenerative femoral head spurring. Lumbar spine degeneration with L4-5 spinal stenosis (there is a transitional S1-2 level). These results were called by telephone at the time of interpretation on 11/29/2019 at 4:06 am to provider Zadie Rhine , who verbally acknowledged these results. IMPRESSION: Acute suppurative appendicitis with early perforation of the posterior wall close to the base. No abscess or appendicolith. Electronically Signed   By: Marnee Spring M.D.   On: 11/29/2019 04:07    Review of Systems  Constitutional: Negative for chills and fever.  HENT: Negative for ear discharge, hearing loss and sore throat.   Eyes: Negative for discharge.  Respiratory: Negative for cough and shortness of breath.   Cardiovascular: Negative for chest pain and leg swelling.  Gastrointestinal: Positive for abdominal pain and nausea. Negative for constipation, diarrhea and vomiting.  Musculoskeletal:  Negative for myalgias and neck pain.  Skin: Negative for rash.  Allergic/Immunologic: Negative for environmental allergies.  Neurological: Negative for dizziness and seizures.  Hematological: Does not bruise/bleed easily.  Psychiatric/Behavioral: Negative for suicidal ideas.  All other systems reviewed and are negative.   Blood pressure 108/77, pulse (!) 112, temperature 98.6 F (37 C), temperature source Oral, resp. rate 17, height 5\' 11"  (1.803 m), weight 105 kg, SpO2 91 %. Physical Exam  Constitutional: He is oriented to person, place, and time. Vital signs are normal. He appears well-developed and well-nourished.  Conversant No acute distress  Eyes: Lids are normal. No scleral icterus.  Pupils are equal round and reactive No lid lag Moist conjunctiva  Neck: No tracheal tenderness present.  No thyromegaly present.  No cervical lymphadenopathy  Cardiovascular: Normal rate, regular rhythm and intact distal pulses.  No murmur heard. Respiratory: Effort normal and breath sounds normal. He has no wheezes. He has no rales.  GI: Soft. Bowel sounds are normal. He exhibits distension. There is no hepatosplenomegaly. There is abdominal tenderness. There is tenderness at McBurney's point. There is no rebound and no guarding. No hernia.  Neurological: He is alert and oriented to person, place, and time.  Normal gait and station  Skin: Skin is warm. No rash noted. No cyanosis. Nails show no clubbing.  Normal skin turgor  Psychiatric: Judgment normal.  Appropriate affect     Assessment/Plan 24M with perforated acute appendicitis Had long discussion with the patient regards to his perforated appendicitis.  I will discussed the CT findings with Dr. Andrey Campanile and decide on surgery versus antibiotics. Continue Zosyn Continue n.p.o.   Axel Filler, MD 11/29/2019, 4:20 AM

## 2019-11-29 NOTE — Progress Notes (Deleted)
Acute perforated appendicitis    Prior to Admission medications   Medication Sig Start Date End Date Taking? Authorizing Provider  acetaminophen (TYLENOL) 500 MG tablet Take 500 mg by mouth every 6 (six) hours as needed.    [provider]  aspirin EC 81 MG tablet Take 162 mg by mouth daily.    [provider]  ibuprofen (ADVIL) 200 MG tablet Take 200 mg by mouth every 6 (six) hours as needed.    [provider]   . dextrose 5 % and 0.9% NaCl 125 mL/hr at 11/29/19 0600  . lactated ringers 10 mL/hr at 11/29/19 0827  . piperacillin-tazobactam (ZOSYN)  IV      Acute perforated appendicitis, purulent and feculent peritonitis Laparoscopic appendectomy 11/29/2019  FEN: IV fluids/clears ID: Zosyn 2/25 >> DVT: Add Lovenox Follow-up: TBD/DOW clinic

## 2019-11-29 NOTE — Interval H&P Note (Signed)
History and Physical Interval Note:  11/29/2019 8:31 AM  Thomas Campos  has presented today for surgery, with the diagnosis of perforated appendicitis.  The various methods of treatment have been discussed with the patient and family. After consideration of risks, benefits and other options for treatment, the patient has consented to  Procedure(s): APPENDECTOMY LAPAROSCOPIC POSSIBLE OPEN (N/A) as a surgical intervention.  The patient's history has been reviewed, patient examined, no change in status, stable for surgery.  I have reviewed the patient's chart and labs.  Questions were answered to the patient's satisfaction.     Reviewed ct  Nontoxic Mild obesity, very TTP in RLQ, +voluntary guarding  Discussed bowel rest + iv abx vs surgical intervention; pros/cons of each  While he has perforated appendicitis there is no abscess, there is some inflammation in RLQ but not severe.   I recommended proceeding with lap appy, poss open  We discussed the etiology and management of acute appendicitis. We discussed operative and nonoperative management.  We discussed laparoscopic appendectomy. We discussed the risk and benefits of surgery including but not limited to bleeding, infection, injury to surrounding structures, need to convert to an open procedure, blood clot formation, post operative abscess or wound infection, staple line complications such as leak or bleeding, hernia formation, post operative ileus, need for additional procedures, anesthesia complications, and the typical postoperative course. I explained that the patient should expect a good improvement in their symptoms.  Discussed increased risk of conversion to open, potential bowel resection, and blood clot given his vte history  He wants to proceed  Will give lovenox preop and oral ERAS meds.   Mary Sella. Andrey Campanile, MD, FACS General, Bariatric, & Minimally Invasive Surgery [facility] [place]      Gaynelle Adu

## 2019-11-29 NOTE — ED Provider Notes (Signed)
[facility] EMERGENCY DEPARTMENT Provider Note   CSN: [id number] Arrival date & time: 11/28/19  2042     History Chief Complaint  Patient presents with  . Abdominal Pain    Thomas Campos is a 49 y.o. male.  The history is provided by the patient.  Abdominal Pain Pain location:  RUQ and RLQ Pain quality: aching   Pain radiates to:  Groin Pain severity:  Moderate Onset quality:  Gradual Duration:  3 days Timing:  Constant Progression:  Worsening Chronicity:  New Relieved by:  Nothing Worsened by:  Movement and palpation Associated symptoms: nausea   Associated symptoms: no dysuria and no fever   Patient reports onset of right-sided abdominal pain over 3 days ago.  It is worsening.  It seemed to radiate to his groin earlier, but that is improved.  No fevers or vomiting.  He has never had this before.  No previous surgical history       PMH=none  Social History   Tobacco Use  . Smoking status: Current Some Day Smoker  . Smokeless tobacco: Never Used  Substance Use Topics  . Alcohol use: Not Currently  . Drug use: Never    Home Medications Prior to Admission medications   Not on File    Allergies    Patient has no known allergies.  Review of Systems   Review of Systems  Constitutional: Negative for fever.  Gastrointestinal: Positive for abdominal pain and nausea.  Genitourinary: Negative for dysuria.  All other systems reviewed and are negative.   Physical Exam Updated Vital Signs BP 109/79   Pulse (!) 107   Temp 98.6 F (37 C) (Oral)   Resp (!) 25   Ht 1.803 m (5\' 11" )   Wt 105 kg   SpO2 98%   BMI 32.29 kg/m   Physical Exam CONSTITUTIONAL: Well developed/well nourished, uncomfortable appearing HEAD: Normocephalic/atraumatic EYES: EOMI NECK: supple no meningeal signs SPINE/BACK:entire spine nontender CV: S1/S2 noted, no murmurs/rubs/gallops noted LUNGS: Lungs are clear to auscultation bilaterally, no apparent  distress ABDOMEN: soft, moderate RLQ tenderness, mild RUQ tenderness, no rebound or guarding, bowel sounds noted throughout abdomen GU:no cva tenderness, no inguinal hernia, no scrotal tenderness, nurse present for exam NEURO: Pt is awake/alert/appropriate, moves all extremitiesx4.  No facial droop.   EXTREMITIES: pulses normal/equal, full ROM SKIN: warm, color normal PSYCH: no abnormalities of mood noted, alert and oriented to situation  ED Results / Procedures / Treatments   Labs (all labs ordered are listed, but only abnormal results are displayed) Labs Reviewed  LIPASE, BLOOD - Abnormal; Notable for the following components:      Result Value   Lipase 80 (*)    All other components within normal limits  COMPREHENSIVE METABOLIC PANEL - Abnormal; Notable for the following components:   Glucose, Bld 120 (*)    Calcium 8.8 (*)    All other components within normal limits  URINALYSIS, ROUTINE W REFLEX MICROSCOPIC - Abnormal; Notable for the following components:   Protein, ur 30 (*)    All other components within normal limits  RESPIRATORY PANEL BY RT PCR (FLU A&B, COVID)  CBC    EKG None  Radiology CT ABDOMEN PELVIS W CONTRAST  Result Date: 11/29/2019 CLINICAL DATA:  Acute nonlocalized abdominal pain EXAM: CT ABDOMEN AND PELVIS WITH CONTRAST TECHNIQUE: Multidetector CT imaging of the abdomen and pelvis was performed using the standard protocol following bolus administration of intravenous contrast. CONTRAST:  12/01/2019 OMNIPAQUE IOHEXOL 300 MG/ML  SOLN  COMPARISON:  None. FINDINGS: Lower chest:  Negative Hepatobiliary: Partial coverage that is negative.no evidence of biliary obstruction or stone. Pancreas: Unremarkable. Spleen: Unremarkable. Adrenals/Urinary Tract: Negative adrenals. No hydronephrosis or stone. Unremarkable bladder. Stomach/Bowel: The appendix is dilated to 15 mm with frothy contents versus stool in the lumen. The mesoappendix is inflamed and there is tiny bubble of  extraluminal gas rightward of the base of the appendix. On coronal reformats gas is associated with the indistinct posterior wall, the site of perforation. No appendicolith. The appendix is in expected location extending inferiorly from the cecum. No abscess. Vascular/Lymphatic: No acute vascular abnormality. Aorto iliac atherosclerotic calcification. No mass or adenopathy. Reproductive:Negative Other: No ascites or pneumoperitoneum. Musculoskeletal: Left greater trochanter and medial femoral head repair with degenerative femoral head spurring. Lumbar spine degeneration with L4-5 spinal stenosis (there is a transitional S1-2 level). These results were called by telephone at the time of interpretation on 11/29/2019 at 4:06 am to provider Ripley Fraise , who verbally acknowledged these results. IMPRESSION: Acute suppurative appendicitis with early perforation of the posterior wall close to the base. No abscess or appendicolith. Electronically Signed   By: Monte Fantasia M.D.   On: 11/29/2019 04:07    Procedures .Critical Care Performed by: Ripley Fraise, MD Authorized by: Ripley Fraise, MD   Critical care provider statement:    Critical care time (minutes):  35   Critical care start time:  11/29/2019 3:00 AM   Critical care end time:  11/29/2019 3:35 AM   Critical care time was exclusive of:  Separately billable procedures and treating other patients   Critical care was necessary to treat or prevent imminent or life-threatening deterioration of the following conditions:  Sepsis   Critical care was time spent personally by me on the following activities:  Ordering and review of radiographic studies, ordering and review of laboratory studies, pulse oximetry, re-evaluation of patient's condition, examination of patient, discussions with consultants, evaluation of patient's response to treatment and development of treatment plan with patient or surrogate   I assumed direction of critical care for this  patient from another provider in my specialty: no       Medications Ordered in ED Medications  sodium chloride flush (NS) 0.9 % injection 3 mL (has no administration in time range)  piperacillin-tazobactam (ZOSYN) IVPB 3.375 g (has no administration in time range)  fentaNYL (SUBLIMAZE) injection 100 mcg (100 mcg Intravenous Given 11/29/19 0158)  ondansetron (ZOFRAN) injection 4 mg (4 mg Intravenous Given 11/29/19 0158)  iohexol (OMNIPAQUE) 300 MG/ML solution 100 mL (100 mLs Intravenous Contrast Given 11/29/19 0356)    ED Course  I have reviewed the triage vital signs and the nursing notes.  Pertinent labs & imaging results that were available during my care of the patient were reviewed by me and considered in my medical decision making (see chart for details).    MDM Rules/Calculators/A&P                      3:04 AM Patient presents for abdominal pain over the past 3 days.  He has significant tenderness on exam.  We will proceed with CT imaging 4:13 AM CT scan reveals acute appendicitis with perforation, no abscess IV antibiotics have been ordered. I discussed the case with Dr. Rosendo Gros with general surgery. He will admit the patient Final Clinical Impression(s) / ED Diagnoses Final diagnoses:  Acute appendicitis with perforation and localized peritonitis, without abscess or gangrene    Rx / DC Orders  ED Discharge Orders    None       Zadie Rhine, MD 11/29/19 [phone]

## 2019-11-29 NOTE — Progress Notes (Addendum)
Received call from Dr. Andrey Campanile, pt did not receive Lovenox on floor, instructed to administer before patient goes back for surgery. Confirmed MD wants pt to continue Zosyn- contacted main pharmacy and requested to tube down to station 25.

## 2019-11-30 LAB — BASIC METABOLIC PANEL
Anion gap: 10 (ref 5–15)
BUN: 14 mg/dL (ref 6–20)
CO2: 21 mmol/L — ABNORMAL LOW (ref 22–32)
Calcium: 8.4 mg/dL — ABNORMAL LOW (ref 8.9–10.3)
Chloride: 105 mmol/L (ref 98–111)
Creatinine, Ser: 1.13 mg/dL (ref 0.61–1.24)
GFR calc Af Amer: >60 mL/min (ref >60)
GFR calc non Af Amer: >60 mL/min (ref >60)
Glucose, Bld: 153 mg/dL — ABNORMAL HIGH (ref 70–99)
Potassium: 4.6 mmol/L (ref 3.5–5.1)
Sodium: 136 mmol/L (ref 135–145)

## 2019-11-30 LAB — CBC
HCT: 37.9 % — ABNORMAL LOW (ref 39.0–52.0)
Hemoglobin: 12.1 g/dL — ABNORMAL LOW (ref 13.0–17.0)
MCH: 28 pg (ref 26.0–34.0)
MCHC: 31.9 g/dL (ref 30.0–36.0)
MCV: 87.7 fL (ref 80.0–100.0)
Platelets: 247 10*3/uL (ref 150–400)
RBC: 4.32 MIL/uL (ref 4.22–5.81)
RDW: 13.5 % (ref 11.5–15.5)
WBC: 15.8 10*3/uL — ABNORMAL HIGH (ref 4.0–10.5)
nRBC: 0 % (ref 0.0–0.2)

## 2019-11-30 LAB — SURGICAL PATHOLOGY

## 2019-11-30 MED ORDER — INFLUENZA VAC SPLIT QUAD 0.5 ML IM SUSY
0.5000 mL | PREFILLED_SYRINGE | Freq: Once | INTRAMUSCULAR | Status: DC
  Filled 2019-11-30: qty 0.5

## 2019-11-30 MED ORDER — SACCHAROMYCES BOULARDII 250 MG PO CAPS
250.0000 mg | ORAL_CAPSULE | Freq: Two times a day (BID) | ORAL | Status: DC
  Administered 2019-11-30 – 2019-12-02 (×5): 250 mg via ORAL
  Filled 2019-11-30 (×5): qty 1

## 2019-11-30 NOTE — Progress Notes (Signed)
1 Day Post-Op    CC: Abdominal pain  Subjective: He looks good this a.m.  He is hungry.  He notes his abdomen is much less distended than it was last evening.  His port sites look fine, and he has some bowel sounds this a.m.  Objective: Vital signs in last 24 hours: Temp:  [97.9 F (36.6 C)-98.4 F (36.9 C)] 98 F (36.7 C) (02/26 0428) Pulse Rate:  [59-106] 59 (02/26 0428) Resp:  [13-19] 16 (02/26 0428) BP: (101-137)/(69-100) 101/69 (02/26 0428) SpO2:  [92 %-98 %] 96 % (02/26 0428) Last BM Date: 11/28/19 480 p.o. 1899 IV 250 urine recorded Afebrile vital signs are stable Glucose 153 WBC 15.8 H/H 12.1/37.9  Intake/Output from previous day: 02/25 0701 - 02/26 0700 In: 2379.6 [P.O.:480; I.V.:1799.6; IV Piggyback:100] Out: 275 [Urine:250; Blood:25] Intake/Output this shift: No intake/output data recorded.  General appearance: alert, cooperative and no distress Resp: clear to auscultation bilaterally Cardio: Regular rate and rhythm GI: Soft, sore, port site dressings are dry. Extremities: extremities normal, atraumatic, no cyanosis or edema  Lab Results:  Recent Labs    11/28/19 2105 11/30/19 0339  WBC 8.4 15.8*  HGB 14.9 12.1*  HCT 46.6 37.9*  PLT 264 247    BMET Recent Labs    11/28/19 2105 11/30/19 0339  NA 137 136  K 4.0 4.6  CL 104 105  CO2 22 21*  GLUCOSE 120* 153*  BUN 9 14  CREATININE 1.01 1.13  CALCIUM 8.8* 8.4*   PT/INR No results for input(s): LABPROT, INR in the last 72 hours.  Recent Labs  Lab 11/28/19 2105  AST 31  ALT 42  ALKPHOS 86  BILITOT 0.7  PROT 6.9  ALBUMIN 3.6     Lipase     Component Value Date/Time   LIPASE 80 (H) 11/28/2019 2105     Prior to Admission medications   Medication Sig Start Date End Date Taking? Authorizing Provider  acetaminophen (TYLENOL) 500 MG tablet Take 500 mg by mouth every 6 (six) hours as needed for mild pain.    Yes [provider]  aspirin EC 81 MG tablet Take 162 mg by mouth  daily.   Yes [provider]  ibuprofen (ADVIL) 200 MG tablet Take 200 mg by mouth every 6 (six) hours as needed.   Yes [provider]    Medications: . acetaminophen  1,000 mg Oral Q6H  . docusate sodium  100 mg Oral BID  . enoxaparin (LOVENOX) injection  40 mg Subcutaneous Daily  . pantoprazole (PROTONIX) IV  40 mg Intravenous QHS  . sodium chloride flush  3 mL Intravenous Once    Assessment/Plan Acute perforated appendicitis, purulent and feculent peritonitis Laparoscopic appendectomy 11/29/2019  FEN: IV fluids/clears ID: Zosyn 2/25 >> day 2 needs 10 day of abx coverage DVT: Add Lovenox Follow-up: TBD/DOW clinic   Plan: Advance him to full liquids, if he does really well we will try and move him up to a soft diet later today.  Continue IV fluids and IV antibiotics.  He will need a total of 10days of antibiotic therapy.  LOS: 1 day    Joelene Barriere 11/30/2019 Please see Amion

## 2019-12-01 LAB — BASIC METABOLIC PANEL
Anion gap: 8 (ref 5–15)
BUN: 12 mg/dL (ref 6–20)
CO2: 25 mmol/L (ref 22–32)
Calcium: 8.3 mg/dL — ABNORMAL LOW (ref 8.9–10.3)
Chloride: 105 mmol/L (ref 98–111)
Creatinine, Ser: 1.11 mg/dL (ref 0.61–1.24)
GFR calc Af Amer: >60 mL/min (ref >60)
GFR calc non Af Amer: >60 mL/min (ref >60)
Glucose, Bld: 97 mg/dL (ref 70–99)
Potassium: 4.1 mmol/L (ref 3.5–5.1)
Sodium: 138 mmol/L (ref 135–145)

## 2019-12-01 LAB — CBC
HCT: 38.2 % — ABNORMAL LOW (ref 39.0–52.0)
Hemoglobin: 12.2 g/dL — ABNORMAL LOW (ref 13.0–17.0)
MCH: 27.8 pg (ref 26.0–34.0)
MCHC: 31.9 g/dL (ref 30.0–36.0)
MCV: 87 fL (ref 80.0–100.0)
Platelets: 261 10*3/uL (ref 150–400)
RBC: 4.39 MIL/uL (ref 4.22–5.81)
RDW: 13.7 % (ref 11.5–15.5)
WBC: 13.6 10*3/uL — ABNORMAL HIGH (ref 4.0–10.5)
nRBC: 0 % (ref 0.0–0.2)

## 2019-12-01 MED ORDER — IBUPROFEN 600 MG PO TABS: 600.0000 mg | ORAL_TABLET | Freq: Four times a day (QID) | ORAL | Status: DC | PRN

## 2019-12-01 NOTE — Discharge Instructions (Signed)
[facility] ______[facility] °LAPAROSCOPIC SURGERY: POST OP INSTRUCTIONS °Always review your discharge instruction sheet given to you by the facility where your surgery was performed. °IF YOU HAVE DISABILITY OR FAMILY LEAVE FORMS, YOU MUST BRING THEM TO THE OFFICE FOR PROCESSING.   °DO NOT GIVE THEM TO YOUR DOCTOR. ° °1. A prescription for pain medication may be given to you upon discharge.  Take your pain medication as prescribed, if needed.  If narcotic pain medicine is not needed, then you may take acetaminophen (Tylenol) or ibuprofen (Advil) as needed. °2. Take your usually prescribed medications unless otherwise directed. °3. If you need a refill on your pain medication, please contact your pharmacy.  They will contact our office to request authorization. Prescriptions will not be filled after 5pm or on week-ends. °4. You should follow a light diet the first few days after arrival home, such as soup and crackers, etc.  Be sure to include lots of fluids daily. °5. Most patients will experience some swelling and bruising in the area of the incisions.  Ice packs will help.  Swelling and bruising can take several days to resolve.  °6. It is common to experience some constipation if taking pain medication after surgery.  Increasing fluid intake and taking a stool softener (such as Colace) will usually help or prevent this problem from occurring.  A mild laxative (Milk of Magnesia or Miralax) should be taken according to package instructions if there are no bowel movements after 48 hours. °7. Unless discharge instructions indicate otherwise, you may remove your bandages 24-48 hours after surgery, and you may shower at that time.  You may have steri-strips (small skin tapes) in place directly over the incision.  These strips should be left on the skin for 7-10 days.  If your surgeon used skin glue on the incision, you may shower in 24 hours.  The glue will flake off over the next 2-3 weeks.  Any sutures or  staples will be removed at the office during your follow-up visit. °8. ACTIVITIES:  You may resume regular (light) daily activities beginning the next day--such as daily self-care, walking, climbing stairs--gradually increasing activities as tolerated.  You may have sexual intercourse when it is comfortable.  Refrain from any heavy lifting or straining until approved by your doctor. °a. You may drive when you are no longer taking prescription pain medication, you can comfortably wear a seatbelt, and you can safely maneuver your car and apply brakes. °b. RETURN TO WORK:  __________________________________________________________ °9. You should see your doctor in the office for a follow-up appointment approximately 2-3 weeks after your surgery.  Make sure that you call for this appointment within a day or two after you arrive home to insure a convenient appointment time. °10. OTHER INSTRUCTIONS: __________________________________________________________________________________________________________________________ __________________________________________________________________________________________________________________________ °WHEN TO CALL YOUR DOCTOR: °1. Fever over 101.0 °2. Inability to urinate °3. Continued bleeding from incision. °4. Increased pain, redness, or drainage from the incision. °5. Increasing abdominal pain ° °The clinic staff is available to answer your questions during regular business hours.  Please don’t hesitate to call and ask to speak to one of the nurses for clinical concerns.  If you have a medical emergency, go to the nearest emergency room or call 911.  A surgeon from [facility] is always on call at the hospital. °[address], [address], [place], [place]  [address] ? [address] [address], [place], [place]   [address] °[phone] ? [phone] ? FAX [phone] °Web site:   [email or web address] °

## 2019-12-01 NOTE — Progress Notes (Signed)
2 Days Post-Op    CC: Abdominal pain  Subjective: Doing okay this a.m. a little bit sore than he felt yesterday.  Port sites are okay.  He did have a low-grade fever earlier this morning.  He is tolerating full liquids without any issue he did have a bowel movement yesterday.  Objective: Vital signs in last 24 hours: Temp:  [97.8 F (36.6 C)-100 F (37.8 C)] 100 F (37.8 C) (02/27 0511) Pulse Rate:  [71-93] 93 (02/27 0511) Resp:  [15-18] 18 (02/27 0511) BP: (115-127)/(74-83) 127/74 (02/27 0511) SpO2:  [95 %-97 %] 95 % (02/27 0511) Last BM Date: 11/30/19 840 p.o. 50 IV 800 urine BM X1 T-max 100 - 0300 hrs. this morning.  Vital signs are stable. BMP is stable WBC 8.4>> 15.8>> 13.6 Intake/Output from previous day: 02/26 0701 - 02/27 0700 In: 890 [P.O.:840; IV Piggyback:50] Out: 800 [Urine:800] Intake/Output this shift: No intake/output data recorded.  General appearance: alert, cooperative and no distress Resp: clear to auscultation bilaterally.  Moving 1250 on I-S GI: Soft, minimal soreness postop.  Few bowel sounds and BM yesterday.  Tolerating full liquids.  Lab Results:  Recent Labs    11/30/19 0339 12/01/19 0453  WBC 15.8* 13.6*  HGB 12.1* 12.2*  HCT 37.9* 38.2*  PLT 247 261    BMET Recent Labs    11/30/19 0339 12/01/19 0453  NA 136 138  K 4.6 4.1  CL 105 105  CO2 21* 25  GLUCOSE 153* 97  BUN 14 12  CREATININE 1.13 1.11  CALCIUM 8.4* 8.3*   PT/INR No results for input(s): LABPROT, INR in the last 72 hours.  Recent Labs  Lab 11/28/19 2105  AST 31  ALT 42  ALKPHOS 86  BILITOT 0.7  PROT 6.9  ALBUMIN 3.6     Lipase     Component Value Date/Time   LIPASE 80 (H) 11/28/2019 2105     Medications: . acetaminophen  1,000 mg Oral Q6H  . docusate sodium  100 mg Oral BID  . enoxaparin (LOVENOX) injection  40 mg Subcutaneous Daily  . influenza vac split quadrivalent PF  0.5 mL Intramuscular Once  . pantoprazole (PROTONIX) IV  40 mg  Intravenous QHS  . saccharomyces boulardii  250 mg Oral BID  . sodium chloride flush  3 mL Intravenous Once    Assessment/Plan Acute perforated appendicitis, purulent and feculent peritonitis Laparoscopic appendectomy 11/29/2019  - WBC 8.4>> 15.8>> 13.6  FEN: IV fluids/full ID: Zosyn 2/25>> day 3 needs 10 day of abx coverage DVT: Add Lovenox Follow-up: TBD/DOW clinic   Plan: Advance him to a soft diet.  Continue to mobilize, continue IV antibiotics.  Recheck labs in a.m. if he continues to do well possibly home tomorrow.       LOS: 2 days    Thayden Lemire 12/01/2019 Please see Amion

## 2019-12-02 LAB — CBC
HCT: 38.8 % — ABNORMAL LOW (ref 39.0–52.0)
Hemoglobin: 12.2 g/dL — ABNORMAL LOW (ref 13.0–17.0)
MCH: 27.2 pg (ref 26.0–34.0)
MCHC: 31.4 g/dL (ref 30.0–36.0)
MCV: 86.6 fL (ref 80.0–100.0)
Platelets: 279 10*3/uL (ref 150–400)
RBC: 4.48 MIL/uL (ref 4.22–5.81)
RDW: 13.7 % (ref 11.5–15.5)
WBC: 10.6 10*3/uL — ABNORMAL HIGH (ref 4.0–10.5)
nRBC: 0 % (ref 0.0–0.2)

## 2019-12-02 LAB — BASIC METABOLIC PANEL
Anion gap: 9 (ref 5–15)
BUN: 10 mg/dL (ref 6–20)
CO2: 23 mmol/L (ref 22–32)
Calcium: 8.2 mg/dL — ABNORMAL LOW (ref 8.9–10.3)
Chloride: 105 mmol/L (ref 98–111)
Creatinine, Ser: 0.95 mg/dL (ref 0.61–1.24)
GFR calc Af Amer: >60 mL/min (ref >60)
GFR calc non Af Amer: >60 mL/min (ref >60)
Glucose, Bld: 100 mg/dL — ABNORMAL HIGH (ref 70–99)
Potassium: 3.7 mmol/L (ref 3.5–5.1)
Sodium: 137 mmol/L (ref 135–145)

## 2019-12-02 MED ORDER — AMOXICILLIN-POT CLAVULANATE 875-125 MG PO TABS: 1.0000 | ORAL_TABLET | Freq: Two times a day (BID) | ORAL | 0 refills | Status: AC

## 2019-12-02 MED ORDER — SACCHAROMYCES BOULARDII 250 MG PO CAPS: ORAL_CAPSULE | ORAL | Status: AC

## 2019-12-02 MED ORDER — ACETAMINOPHEN 500 MG PO TABS: ORAL_TABLET | ORAL | 0 refills | Status: AC

## 2019-12-02 MED ORDER — OXYCODONE HCL 5 MG PO TABS: 5.0000 mg | ORAL_TABLET | ORAL | 0 refills | Status: DC | PRN

## 2019-12-02 MED ORDER — IBUPROFEN 200 MG PO TABS: ORAL_TABLET | ORAL | Status: AC

## 2019-12-02 MED ORDER — AMOXICILLIN-POT CLAVULANATE 875-125 MG PO TABS
1.0000 | ORAL_TABLET | Freq: Two times a day (BID) | ORAL | Status: DC
  Administered 2019-12-02: 1 via ORAL
  Filled 2019-12-02: qty 1

## 2019-12-02 MED ORDER — OXYCODONE HCL 5 MG PO TABS: ORAL_TABLET | ORAL | 0 refills | Status: AC

## 2019-12-02 NOTE — Plan of Care (Signed)
  Problem: Health Behavior/Discharge Planning: Goal: Ability to manage health-related needs will improve Outcome: Progressing  Patient verbalizes to not do heavy lifting at home.  Problem: Clinical Measurements: Goal: Will remain free from infection Outcome: Progressing  Patient afebrile.  Problem: Activity: Goal: Risk for activity intolerance will decrease Outcome: Progressing  Patient denies pain when ambulating.

## 2019-12-02 NOTE — Progress Notes (Signed)
3 Days Post-Op    CC: Abdominal pain  Subjective: He is ambulating well.  Tolerating a soft diet.  He has some soreness primarily right lower quadrant.  Port sites all look good.  He is having bowel movements.  Objective: Vital signs in last 24 hours: Temp:  [97.6 F (36.4 C)-98.6 F (37 C)] 97.6 F (36.4 C) (02/28 0421) Pulse Rate:  [71-86] 71 (02/28 0421) Resp:  [18] 18 (02/28 0421) BP: (109-122)/(73-87) 122/78 (02/28 0421) SpO2:  [97 %] 97 % (02/28 0421) Last BM Date: 12/01/19  1220 p.o. 1500 urine BM x3 BMP is stable potassium 3.7 WBC 10.6 H/H stable Intake/Output from previous day: 02/27 0701 - 02/28 0700 In: 1220 [P.O.:1220] Out: 1500 [Urine:1500] Intake/Output this shift: Total I/O In: -  Out: 700 [Urine:700]  General appearance: alert, cooperative, no distress and Walking halls unassisted Resp: clear to auscultation bilaterally GI: Soft, sore, port sites all look good.  Lab Results:  Recent Labs    12/01/19 0453 12/02/19 0437  WBC 13.6* 10.6*  HGB 12.2* 12.2*  HCT 38.2* 38.8*  PLT 261 279    BMET Recent Labs    12/01/19 0453 12/02/19 0437  NA 138 137  K 4.1 3.7  CL 105 105  CO2 25 23  GLUCOSE 97 100*  BUN 12 10  CREATININE 1.11 0.95  CALCIUM 8.3* 8.2*   PT/INR No results for input(s): LABPROT, INR in the last 72 hours.  Recent Labs  Lab 11/28/19 2105  AST 31  ALT 42  ALKPHOS 86  BILITOT 0.7  PROT 6.9  ALBUMIN 3.6     Lipase     Component Value Date/Time   LIPASE 80 (H) 11/28/2019 2105     Medications: . acetaminophen  1,000 mg Oral Q6H  . docusate sodium  100 mg Oral BID  . enoxaparin (LOVENOX) injection  40 mg Subcutaneous Daily  . influenza vac split quadrivalent PF  0.5 mL Intramuscular Once  . pantoprazole (PROTONIX) IV  40 mg Intravenous QHS  . saccharomyces boulardii  250 mg Oral BID  . sodium chloride flush  3 mL Intravenous Once    Assessment/Plan Acute perforated appendicitis, purulent and feculent  peritonitis Laparoscopic appendectomy 11/29/2019  - WBC 8.4>> 15.8>> 13.6>> 10.6  FEN: IV soft diet ID: Zosyn 2/25>>day 4 needs 10 day of abx coverage DVT: Add Lovenox Follow-up: TBD/DOW clinic Pain: Tylenol 1 g x 4 oxycodone 10 mg x 1   Plan: We will discharge him home today with 6 more days of Augmentin.  He was instructed to call symptoms that would suggest postoperative abscess formation.     LOS: 3 days    Stepfanie Yott 12/02/2019 Please see Amion

## 2019-12-02 NOTE — Discharge Summary (Signed)
Physician Discharge Summary  Patient ID: Thomas Campos MRN: [id number] DOB/AGE: 08-Sep-1971 49 y.o.  Admit date: 11/28/2019 Discharge date: 12/02/2019  Admission Diagnoses:  Perforated acute appendicitis  Discharge Diagnoses:  Acute perforated appendicitis, purulent and feculent peritonitis  Active Problems:   Perforated appendicitis   PROCEDURES: Laparoscopic appendectomy 11/29/2019 Dr. Doreatha Massed Course:  Patient is a 49 year old male who comes in secondary to 3 days of abdominal pain.  Patient states that the pain began periumbilically and then localized in the right lower quadrant.  Patient states that he has been taking Tylenol/ibuprofen help with the pain and been continuing to work.  He states that he had no fever or chills however did have some diarrhea.  He states that secondary to ongoing pain he presented to the ER for further evaluation. Upon evaluation the ER he underwent CT scan which revealed perforated appendicitis, no signs of abscess or fluid. General surgery was consulted for further evaluation and management. 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  Patient was seen by Dr. Axel Filler and admitted.  He was then seen the following a.m. by Dr. Gaynelle Adu taken the operating room that morning and underwent procedure as described above.  Despite the purulent feculent peritonitis he is doing well.  He was maintained on IV antibiotics through 12/02/2019.  He remained afebrile postoperatively.  His diet was slowly advanced, with concerns for postoperative ileus.  He has done well with this.  He is remained afebrile.  WBC 8.4>>15.8>>13.6>> 10.6; WBC rose after his initial surgery but has improved steadily.  By the a.m. 12/02/2019 he was ready for discharge.  He will go home on 6 more days of Augmentin.  We discussed possibility of post  procedure abscess, symptoms of recurrent abscess, he was instructed to call if he has any questions or symptoms.  Condition on discharge: Improved  CBC Latest Ref Rng & Units 12/02/2019 12/01/2019 11/30/2019  WBC 4.0 - 10.5 K/uL 10.6(H) 13.6(H) 15.8(H)  Hemoglobin 13.0 - 17.0 g/dL 12.2(L) 12.2(L) 12.1(L)  Hematocrit 39.0 - 52.0 % 38.8(L) 38.2(L) 37.9(L)  Platelets 150 - 400 K/uL 279 261 247   CMP Latest Ref Rng & Units 12/02/2019 12/01/2019 11/30/2019  Glucose 70 - 99 mg/dL 532(D) 97 924(Q)  BUN 6 - 20 mg/dL 10 12 14   Creatinine 0.61 - 1.24 mg/dL 6.83 4.19  Sodium 135 - 145 mmol/L 137 138 136  Potassium 3.5 - 5.1 mmol/L 3.7 4.1 4.6  Chloride 98 - 111 mmol/L 105 105 105  CO2 22 - 32 mmol/L 23 25 21(L)  Calcium 8.9 - 10.3 mg/dL 8.2(L) 8.3(L) 8.4(L)  Total Protein 6.5 - 8.1 g/dL - - -  Total Bilirubin 0.3 - 1.2 mg/dL - - -  Alkaline Phos 38 - 126 U/L - - -  AST 15 - 41 U/L - - -  ALT 0 - 44 U/L - - -    Disposition: Discharge disposition: 01-Home or Self Care        Allergies as of 12/02/2019   No Known Allergies     Medication List    TAKE these medications   acetaminophen 500 MG tablet Commonly known as: TYLENOL You can take 1000 mg of Tylenol/acetaminophen every 6 hours as needed for pain.  This is your first pain medication.  You can alternate this with ibuprofen.  You can buy Tylenol/acetaminophen over the counter at  any drugstore.  Do not take more than 4000 mg of Tylenol/acetaminophen per day it can harm your liver. What changed:   how much to take  how to take this  when to take this  reasons to take this  additional instructions   amoxicillin-clavulanate 875-125 MG tablet Commonly known as: AUGMENTIN Take 1 tablet by mouth every 12 (twelve) hours.   aspirin EC 81 MG tablet Take 162 mg by mouth daily.   ibuprofen 200 MG tablet Commonly known as: ADVIL You can take 2 to 3 tablets every 6 hours as needed for pain.  You can alternate with  Tylenol/acetaminophen.  You can buy this over-the-counter at any drugstore. What changed:   how much to take  how to take this  when to take this  reasons to take this  additional instructions   oxyCODONE 5 MG immediate release tablet Commonly known as: Oxy IR/ROXICODONE Take 1 tablet (5 mg total) by mouth every 4 (four) hours as needed for severe pain or breakthrough pain (Pain not relieved by Tylenol & ibuprofen).   saccharomyces boulardii 250 MG capsule Commonly known as: FLORASTOR This is the probiotic.  You can buy this over-the-counter at any drugstore.  Follow package directions for whichever brand you choose.      Follow-up Information    [facility] [place] Follow up on 12/18/2019.   Specialty: General Surgery Why: Your appointment is at 10:15 AM.  In the office 30 minutes early for check-in.  Bring photo ID and insurance information. Contact information: [address] [place] [address] [phone]           Signed: Earnstine Regal 12/02/2019, 8:07 AM

## 2020-04-15 IMAGING — CT CT ABD-PELV W/ CM
2 of 6 series · 16 of 46 positions shown, 18 images · IV contrast (omnipaque)
Comparison: None.

CLINICAL DATA: Acute nonlocalized abdominal pain

EXAM:
CT ABDOMEN AND PELVIS WITH CONTRAST
TECHNIQUE: Multidetector CT imaging of the abdomen and pelvis was performed
using the standard protocol following bolus administration of
intravenous contrast.
CONTRAST:  100mL OMNIPAQUE IOHEXOL 300 MG/ML  SOLN

[Series 3: abdomen 5.0 · axial · 0.88mm/px · z∈[+772,+1226]mm · 13 of 105 slices shown, 15 images]
[im 7/105  soft-tissue]
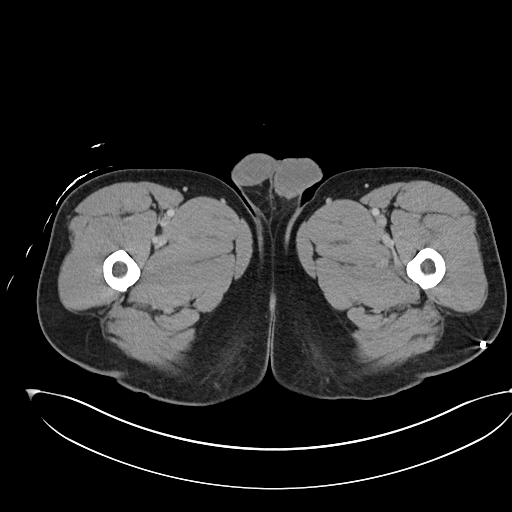
[im 7/105  bone]
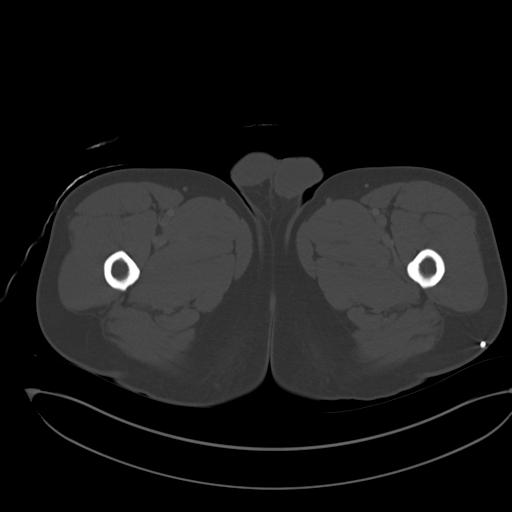
[im 14/105  soft-tissue]
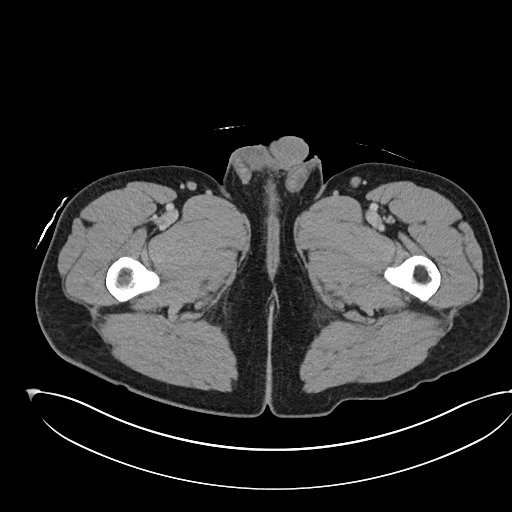
[im 20/105  soft-tissue]
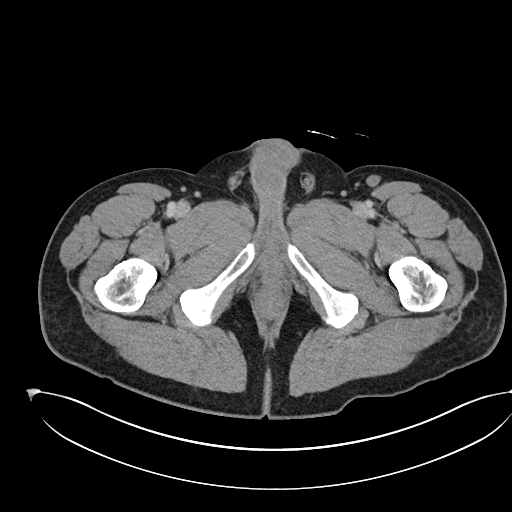
[im 33/105  soft-tissue]
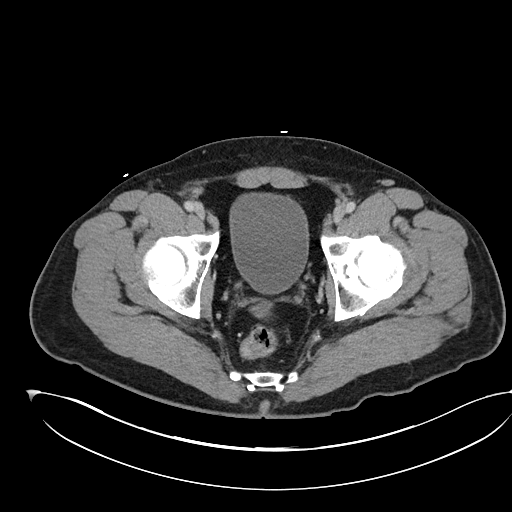
[im 40/105  soft-tissue]
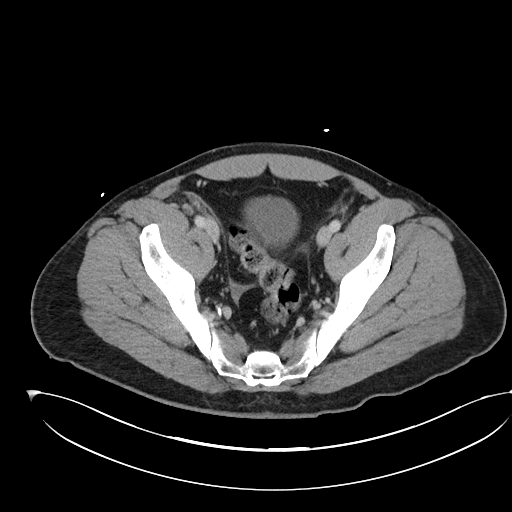
[im 46/105  soft-tissue]
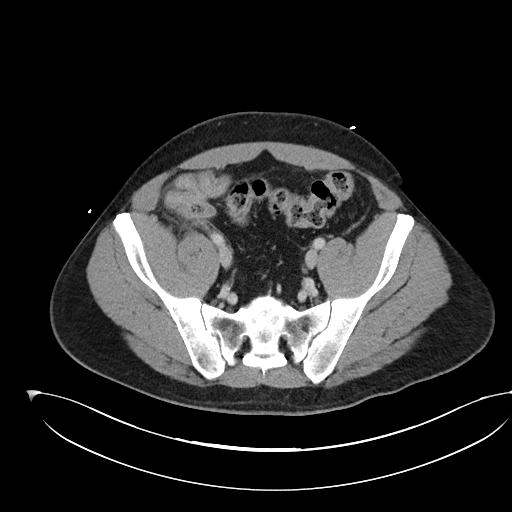
[im 53/105  soft-tissue]
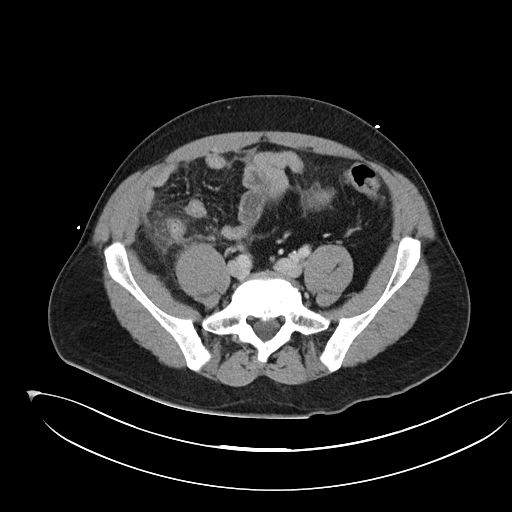
[im 59/105  soft-tissue]
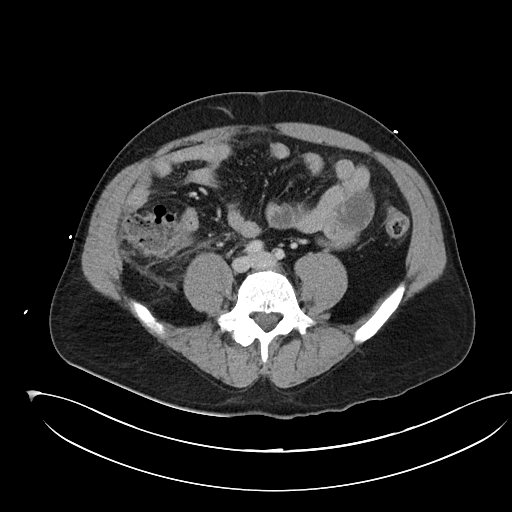
[im 66/105  soft-tissue]
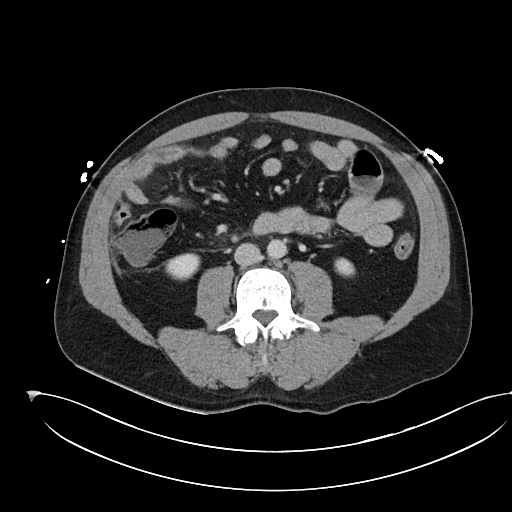
[im 66/105  bone]
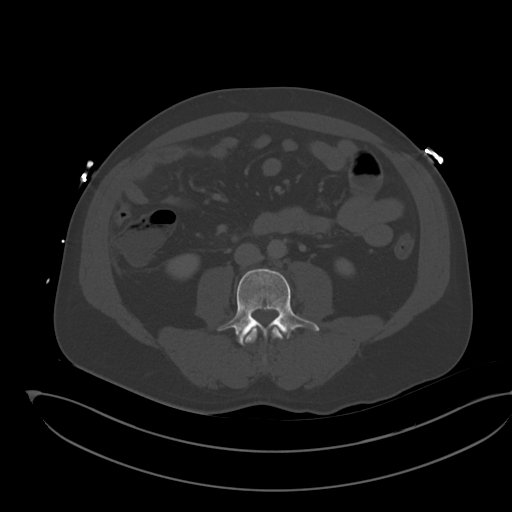
[im 72/105  soft-tissue]
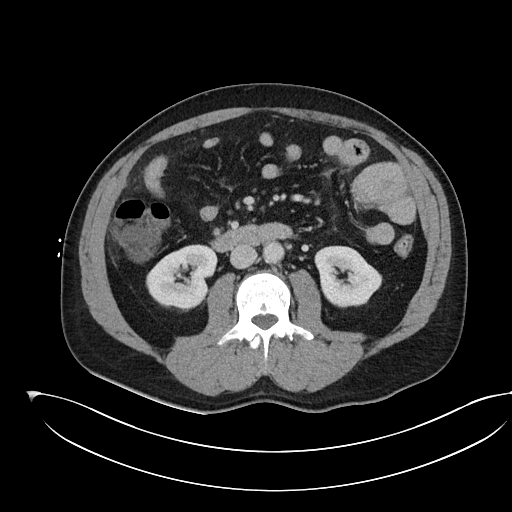
[im 85/105  soft-tissue]
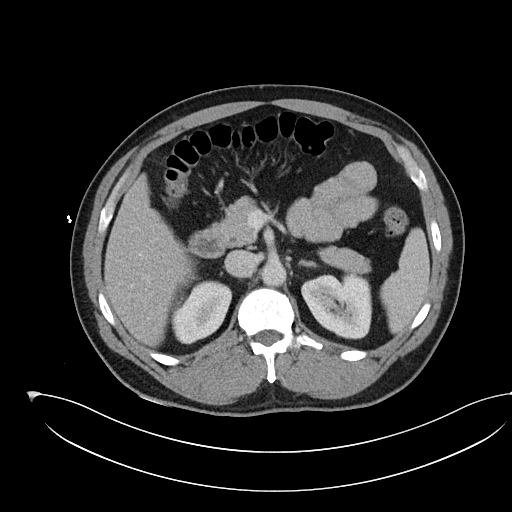
[im 92/105  soft-tissue]
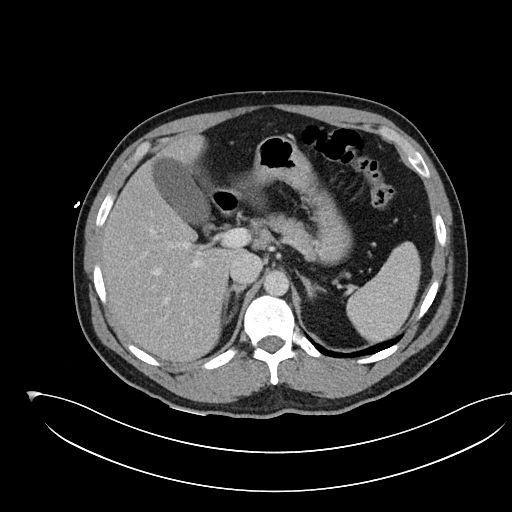
[im 98/105  soft-tissue]
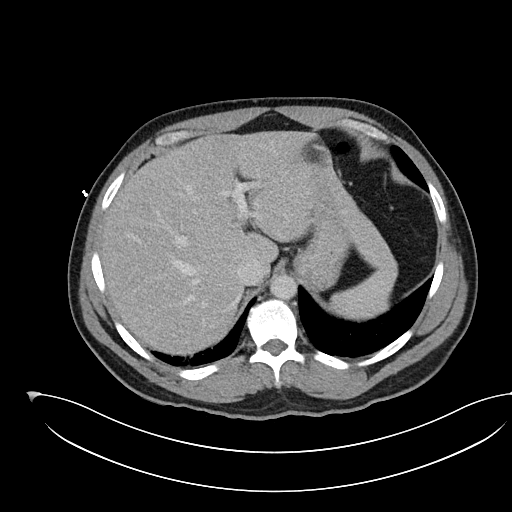

[Series 6: abdomen 3.0 mpr cor · coronal · 0.80mm/px · 3 of 101 slices shown]
[im 34/101  soft-tissue]
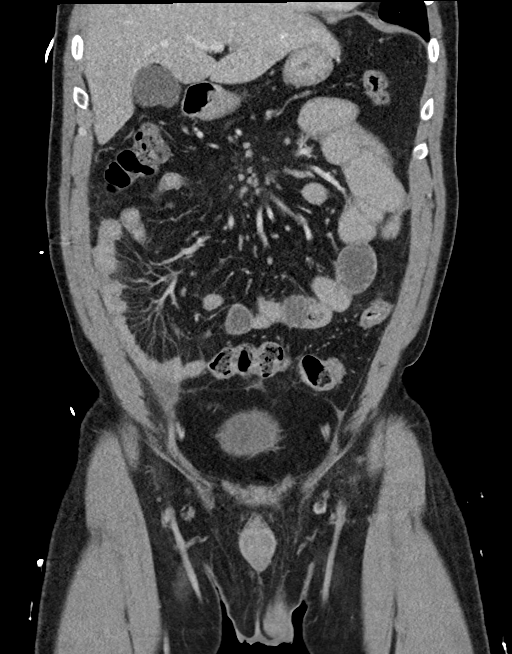
[im 45/101  soft-tissue]
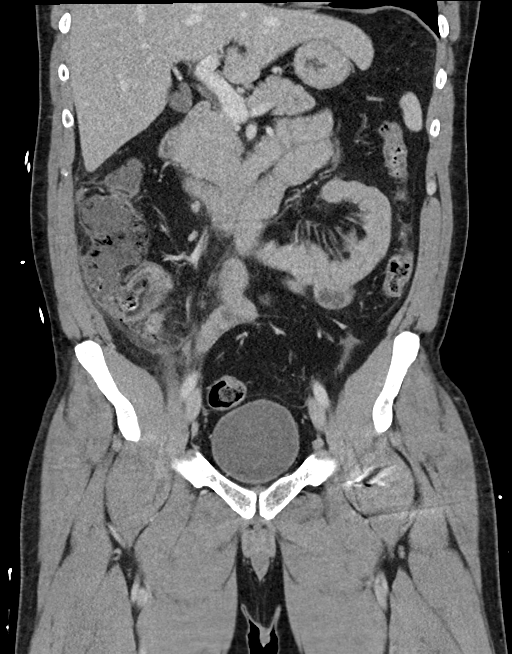
[im 56/101  soft-tissue]
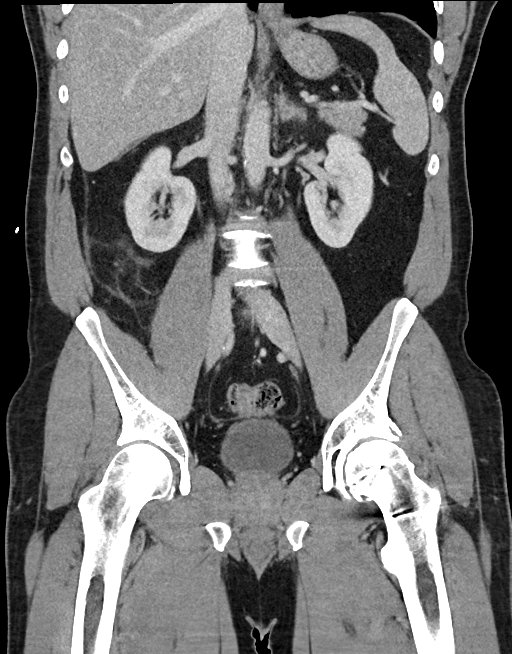

[16 of 46 positions shown; findings below may reference images not displayed]

FINDINGS: Lower chest:  Negative

Hepatobiliary: Partial coverage that is negative.no evidence of
biliary obstruction or stone.

Pancreas: Unremarkable.

Spleen: Unremarkable.

Adrenals/Urinary Tract: Negative adrenals. No hydronephrosis or
stone. Unremarkable bladder.

Stomach/Bowel: The appendix is dilated to 15 mm with frothy contents
versus stool in the lumen. The mesoappendix is inflamed and there is
tiny bubble of extraluminal gas rightward of the base of the
appendix. On coronal reformats gas is associated with the indistinct
posterior wall, the site of perforation. No appendicolith. The
appendix is in expected location extending inferiorly from the
cecum. No abscess.

Vascular/Lymphatic: No acute vascular abnormality. Aorto iliac
atherosclerotic calcification. No mass or adenopathy.

Reproductive:Negative

Other: No ascites or pneumoperitoneum.

Musculoskeletal: Left greater trochanter and medial femoral head
repair with degenerative femoral head spurring. Lumbar spine
degeneration with L4-5 spinal stenosis (there is a transitional S1-2
level).

These results were called by telephone at the time of interpretation
on 11/29/2019 at [DATE] to provider KARLSSON VAA , who verbally
acknowledged these results.
IMPRESSION: Acute suppurative appendicitis with early perforation of the
posterior wall close to the base. No abscess or appendicolith.

## 2024-04-17 ENCOUNTER — Other Ambulatory Visit (HOSPITAL_COMMUNITY): Payer: Self-pay
